# Patient Record
Sex: Female | Born: 1970
Health system: Southern US, Community
[De-identification: ages and names within clinical notes are randomized; demographics above are authoritative.]

## PROBLEM LIST (undated history)

## (undated) DIAGNOSIS — J45909 Unspecified asthma, uncomplicated: Secondary | ICD-10-CM

## (undated) DIAGNOSIS — A630 Anogenital (venereal) warts: Secondary | ICD-10-CM

## (undated) DIAGNOSIS — F329 Major depressive disorder, single episode, unspecified: Secondary | ICD-10-CM

## (undated) DIAGNOSIS — R87612 Low grade squamous intraepithelial lesion on cytologic smear of cervix (LGSIL): Secondary | ICD-10-CM

## (undated) DIAGNOSIS — E669 Obesity, unspecified: Secondary | ICD-10-CM

## (undated) DIAGNOSIS — F32A Depression, unspecified: Secondary | ICD-10-CM

## (undated) DIAGNOSIS — K219 Gastro-esophageal reflux disease without esophagitis: Secondary | ICD-10-CM

## (undated) HISTORY — DX: Obesity, unspecified: E66.9

## (undated) HISTORY — DX: Anogenital (venereal) warts: A63.0

## (undated) HISTORY — DX: Unspecified asthma, uncomplicated: J45.909

## (undated) HISTORY — DX: Depression, unspecified: F32.A

## (undated) HISTORY — DX: Gastro-esophageal reflux disease without esophagitis: K21.9

## (undated) HISTORY — DX: Major depressive disorder, single episode, unspecified: F32.9

## (undated) HISTORY — DX: Low grade squamous intraepithelial lesion on cytologic smear of cervix (LGSIL): R87.612

---

## 1998-11-28 HISTORY — PX: TONSILLECTOMY AND ADENOIDECTOMY: SUR1326

## 2005-04-12 ENCOUNTER — Ambulatory Visit: Payer: Self-pay | Admitting: Family Medicine

## 2006-12-04 ENCOUNTER — Ambulatory Visit: Payer: Self-pay | Admitting: Internal Medicine

## 2010-11-28 HISTORY — PX: LAPAROSCOPIC GASTRIC SLEEVE RESECTION: SHX5895

## 2011-01-20 ENCOUNTER — Ambulatory Visit: Payer: Self-pay | Admitting: Family Medicine

## 2011-03-28 ENCOUNTER — Ambulatory Visit: Payer: Self-pay | Admitting: Specialist

## 2011-04-06 ENCOUNTER — Ambulatory Visit: Payer: Self-pay | Admitting: Specialist

## 2011-04-12 ENCOUNTER — Ambulatory Visit: Payer: Self-pay | Admitting: Specialist

## 2011-04-19 ENCOUNTER — Ambulatory Visit: Payer: Self-pay | Admitting: Unknown Physician Specialty

## 2011-04-22 ENCOUNTER — Ambulatory Visit: Payer: Self-pay | Admitting: Unknown Physician Specialty

## 2011-04-26 HISTORY — PX: HYSTEROSCOPY WITH D & C: SHX1775

## 2011-04-29 ENCOUNTER — Ambulatory Visit: Payer: Self-pay | Admitting: Specialist

## 2012-03-22 ENCOUNTER — Ambulatory Visit: Payer: Self-pay | Admitting: Obstetrics & Gynecology

## 2012-03-22 LAB — CBC
HCT: 37.2 % (ref 35.0–47.0)
MCH: 28 pg (ref 26.0–34.0)
MCHC: 32.6 g/dL (ref 32.0–36.0)
MCV: 86 fL (ref 80–100)
RDW: 15.6 % — ABNORMAL HIGH (ref 11.5–14.5)

## 2012-03-30 ENCOUNTER — Ambulatory Visit: Payer: Self-pay | Admitting: Obstetrics & Gynecology

## 2012-03-30 LAB — PREGNANCY, URINE: Pregnancy Test, Urine: NEGATIVE m[IU]/mL

## 2012-04-03 HISTORY — PX: TOTAL VAGINAL HYSTERECTOMY: SHX2548

## 2012-04-03 HISTORY — PX: PUBOVAGINAL SLING: SHX1035

## 2013-02-11 ENCOUNTER — Emergency Department: Payer: Self-pay | Admitting: Emergency Medicine

## 2014-04-13 ENCOUNTER — Emergency Department: Payer: Self-pay | Admitting: Emergency Medicine

## 2015-03-22 NOTE — Op Note (Signed)
PATIENT NAME:  Judy Simmons, Judy Simmons MR#:  [id number] DATE OF BIRTH:  01/15/1971  DATE OF PROCEDURE:  03/30/2012  PREOPERATIVE DIAGNOSIS: Cystocele, pelvic organ prolapse, and stress incontinence.   POSTOPERATIVE DIAGNOSIS: Cystocele, pelvic organ prolapse, and stress incontinence.   PROCEDURES PERFORMED: Total vaginal hysterectomy, anterior colporrhaphy, pubovaginal sling using TVT device, cystoscopy, and modified McCall culdoplasty.   SURGEON: Dierdre Searles. Paul Rennee Coyne, Simmons.D.   ASSISTANT: Thomasene MohairStephen Jackson, Simmons.D.   ANESTHESIA: General.   ESTIMATED BLOOD LOSS: 50 mL.   COMPLICATIONS: None.   FINDINGS: Normal ovaries that are left in place, uterus slightly enlarged, prolapse and cystocele noted.   DISPOSITION: To the recovery room in stable condition.   TECHNIQUE: The patient is prepped and draped in the usual sterile fashion after adequate anesthesia is obtained in the dorsal lithotomy position. A Foley catheter is inserted. The cervix is grasped with a tenaculum. The circumference of the cervix is infiltrated with 1% lidocaine with epinephrine and then incised using Bovie electrocautery. Dissection with Metzenbaum scissors is performed until penetration of the anterior and posterior peritoneum. A long weighted speculum is placed in the posterior cul-de-sac. The uterosacral ligaments are clamped, transected, and suture ligated, and then sutured to the vaginal cuff. The uterine arteries are clamped, transected, and suture ligated with further dissection with clamping and suturing until the adnexa is clamped and the uterus is amputated. It is removed without difficulty. Excellent hemostasis is noted. The peritoneum is then closed with a pursestring 0 Vicryl suture. Ovaries are left in place. An Ethilon suture is placed to plicate the uterosacral ligaments and the posterior peritoneum.   The anterior vaginal wall is identified and clamps are placed in this region. The midline vaginal wall is infiltrated with  1% lidocaine with epinephrine and then incision made with a scalpel and extended with Metzenbaum scissors. The endopelvic fascia is dissected away from the vaginal mucosa. The direction towards the retropubic space is identified and dissected.   The Foley catheter is removed and a rigid catheter guide is placed through an 2218 French Foley catheter to deviate the bladder to either side. The TVT Exact pubovaginal sling is then placed. Trocars are placed through the retropubic space on either side with deviation of the bladder wall performing this. Trocars are removed up through the skin in the area of the mons pubis. Cystoscopy is performed with saline distention of the bladder with no injuries or bleeding noted, no graft in the bladder, and no other problems with the bladder. Cystoscope is removed and Foley catheter is inserted. The TVT is then pulled forward and pressure on the bladder before the catheter was placed revealed no leakage. The graft was careful not to be placed too tight. The sheaths are removed and is cut off at the level of the skin. Skin is closed with Dermabond. Plication sutures are then placed vaginally to imbricate the graft and then to plicate the fascial tissues for completion of the anterior colporrhaphy. Excess vaginal mucosa is excised and the vaginal incision is closed with a 0 Vicryl suture in a running locking fashion to incorporate both anterior colporrhaphy and hysterectomy sites. Vaginal cavity is irrigated and excellent hemostasis is visualized. A packing vaginal sponge is placed. Foley catheter is left in place. The patient goes to the recovery room in stable condition. All sponge, instrument, and needle counts are correct.  ____________________________ R. Annamarie MajorPaul Haizlee Henton, MD rph:slb D: 03/30/2012 16:00:47 ET T: 03/30/2012 16:34:50 ET JOB#: [id number]  cc: Dierdre Searles. Paul Massie Cogliano, MD, <Dictator> Makail Watling P  Nik Gorrell MD ELECTRONICALLY SIGNED 04/03/2012 23:27

## 2016-05-05 DIAGNOSIS — Z113 Encounter for screening for infections with a predominantly sexual mode of transmission: Secondary | ICD-10-CM | POA: Diagnosis not present

## 2016-05-05 DIAGNOSIS — F329 Major depressive disorder, single episode, unspecified: Secondary | ICD-10-CM | POA: Diagnosis not present

## 2016-05-05 DIAGNOSIS — F419 Anxiety disorder, unspecified: Secondary | ICD-10-CM | POA: Diagnosis not present

## 2016-08-27 DIAGNOSIS — J40 Bronchitis, not specified as acute or chronic: Secondary | ICD-10-CM | POA: Diagnosis not present

## 2016-11-01 DIAGNOSIS — H5213 Myopia, bilateral: Secondary | ICD-10-CM | POA: Diagnosis not present

## 2016-12-05 DIAGNOSIS — K219 Gastro-esophageal reflux disease without esophagitis: Secondary | ICD-10-CM | POA: Diagnosis not present

## 2016-12-05 DIAGNOSIS — K529 Noninfective gastroenteritis and colitis, unspecified: Secondary | ICD-10-CM | POA: Diagnosis not present

## 2017-01-16 DIAGNOSIS — M79674 Pain in right toe(s): Secondary | ICD-10-CM | POA: Diagnosis not present

## 2017-01-16 DIAGNOSIS — A499 Bacterial infection, unspecified: Secondary | ICD-10-CM | POA: Diagnosis not present

## 2017-01-16 DIAGNOSIS — B351 Tinea unguium: Secondary | ICD-10-CM | POA: Diagnosis not present

## 2017-01-23 ENCOUNTER — Encounter: Payer: Self-pay | Admitting: Family

## 2017-01-23 ENCOUNTER — Ambulatory Visit (INDEPENDENT_AMBULATORY_CARE_PROVIDER_SITE_OTHER): Payer: BLUE CROSS/BLUE SHIELD | Admitting: Family

## 2017-01-23 VITALS — BP 116/68 | HR 88 | Temp 98.1°F | Resp 15 | Ht 65.5 in | Wt 249.0 lb

## 2017-01-23 DIAGNOSIS — M5441 Lumbago with sciatica, right side: Secondary | ICD-10-CM | POA: Diagnosis not present

## 2017-01-23 DIAGNOSIS — F339 Major depressive disorder, recurrent, unspecified: Secondary | ICD-10-CM

## 2017-01-23 MED ORDER — PREDNISONE 10 MG PO TABS: ORAL_TABLET | ORAL | 0 refills | Status: DC

## 2017-01-23 MED ORDER — DICLOFENAC SODIUM 1 % TD GEL: 4.0000 g | Freq: Four times a day (QID) | TRANSDERMAL | 3 refills | Status: DC

## 2017-01-23 NOTE — Progress Notes (Signed)
Pre visit review using our clinic review tool, if applicable. No additional management support is needed unless otherwise documented below in the visit note. 

## 2017-01-23 NOTE — Assessment & Plan Note (Signed)
Symptoms consistent with sciatica. Empiric trial of prednisone. No red flag, alarm symptoms today. Patient is not a candidate for NSAIDs. Topical Voltaren gel. Return in one month.

## 2017-01-23 NOTE — Patient Instructions (Addendum)
Come back for fasting labs and physical.   Trial of prednisone. Voltaren gel.  If you start to have unusual thoughts, thoughts of hurting yourself, or anyone else, please go immediately to the emergency department.   Follow up in one month.   National Suicide Prevention Hotline - available 24 hours a day, 7 days a week.  (707)370-4106  Major Depressive Disorder Major depressive disorder is a mental illness. It also may be called clinical depression or unipolar depression. Major depressive disorder usually causes feelings of sadness, hopelessness, or helplessness. Some people with this disorder do not feel particularly sad but lose interest in doing things they used to enjoy (anhedonia). Major depressive disorder also can cause physical symptoms. It can interfere with work, school, relationships, and other normal everyday activities. The disorder varies in severity but is longer lasting and more serious than the sadness we all feel from time to time in our lives. Major depressive disorder often is triggered by stressful life events or major life changes. Examples of these triggers include divorce, loss of your job or home, a move, and the death of a family member or close friend. Sometimes this disorder occurs for no obvious reason at all. People who have family members with major depressive disorder or bipolar disorder are at higher risk for developing this disorder, with or without life stressors. Major depressive disorder can occur at any age. It may occur just once in your life (single episode major depressive disorder). It may occur multiple times (recurrent major depressive disorder). SYMPTOMS People with major depressive disorder have either anhedonia or depressed mood on nearly a daily basis for at least 2 weeks or longer. Symptoms of depressed mood include:  Feelings of sadness (blue or down in the dumps) or emptiness.  Feelings of hopelessness or helplessness.  Tearfulness or episodes  of crying (may be observed by others).  Irritability (children and adolescents). In addition to depressed mood or anhedonia or both, people with this disorder have at least four of the following symptoms:  Difficulty sleeping or sleeping too much.   Significant change (increase or decrease) in appetite or weight.   Lack of energy or motivation.  Feelings of guilt and worthlessness.   Difficulty concentrating, remembering, or making decisions.  Unusually slow movement (psychomotor retardation) or restlessness (as observed by others).   Recurrent wishes for death, recurrent thoughts of self-harm (suicide), or a suicide attempt. People with major depressive disorder commonly have persistent negative thoughts about themselves, other people, and the world. People with severe major depressive disorder may experiencedistorted beliefs or perceptions about the world (psychotic delusions). They also may see or hear things that are not real (psychotic hallucinations). DIAGNOSIS Major depressive disorder is diagnosed through an assessment by your health care provider. Your health care provider will ask aboutaspects of your daily life, such as mood,sleep, and appetite, to see if you have the diagnostic symptoms of major depressive disorder. Your health care provider may ask about your medical history and use of alcohol or drugs, including prescription medicines. Your health care provider also may do a physical exam and blood work. This is because certain medical conditions and the use of certain substances can cause major depressive disorder-like symptoms (secondary depression). Your health care provider also may refer you to a mental health specialist for further evaluation and treatment. TREATMENT It is important to recognize the symptoms of major depressive disorder and seek treatment. The following treatments can be prescribed for this disorder:   Medicine. Antidepressant medicines  usually are  prescribed. Antidepressant medicines are thought to correct chemical imbalances in the brain that are commonly associated with major depressive disorder. Other types of medicine may be added if the symptoms do not respond to antidepressant medicines alone or if psychotic delusions or hallucinations occur.  Talk therapy. Talk therapy can be helpful in treating major depressive disorder by providing support, education, and guidance. Certain types of talk therapy also can help with negative thinking (cognitive behavioral therapy) and with relationship issues that trigger this disorder (interpersonal therapy). A mental health specialist can help determine which treatment is best for you. Most people with major depressive disorder do well with a combination of medicine and talk therapy. Treatments involving electrical stimulation of the brain can be used in situations with extremely severe symptoms or when medicine and talk therapy do not work over time. These treatments include electroconvulsive therapy, transcranial magnetic stimulation, and vagal nerve stimulation.   This information is not intended to replace advice given to you by your health care provider. Make sure you discuss any questions you have with your health care provider.   Document Released: 03/11/2013 Document Revised: 12/05/2014 Document Reviewed: 03/11/2013 Elsevier Interactive Patient Education [employer]2016 Elsevier Inc.

## 2017-01-23 NOTE — Assessment & Plan Note (Signed)
Patient assured me she has no active suicidal thoughts. She continues on "for her grandson". Suicide contract in place. Follow-up in one month. Long discussion about possibly trying Wellbutrin. We'll discuss at next visit. Patient politely declined any medication or referral to counseling at this time.

## 2017-01-23 NOTE — Progress Notes (Signed)
Subjective:    Patient ID: Judy Simmons, female    DOB: 1971/10/17, 46 y.o.   MRN: [id number]  CC: Judy Simmons is a 46 y.o. female who presents today for an acute visit.    HPI: Judy Simmons - will follow with Pap.   Asthma- well controlled. Using inhaler few times per year.   Depression- current. 'rough past 2 years. ' sad about weight gain since bypass. Fleeting thoughts if she wasn't living anymore. No active suicide plan or thoughts of hurting anyone else. Goes on 'because of her grandson.' Has been on antidepressants in the past, didn't like the side effects.   Complains today low back pain for 2 weeks, waxing and waning.  Right low back, hip and then radiates to right front of leg.  numbness, tingling of right leg. Tried Has a lot of moving and lifting at work at [employer] which has aggravated back. Pain worse in the beginning with standing and 'now pain is all the time.' Describes as dull ache and then at times stabbing pain. Tried ibuprofen with little relief. Heat hasn't  Helped. No h/o cancer, saddle anesthesia, urinary incontinence.   H/o gastric bypass.     HISTORY:  Past Medical History:  Diagnosis Date  . Asthma   . Depression   . GERD (gastroesophageal reflux disease)    Past Surgical History:  Procedure Laterality Date  . ABDOMINAL HYSTERECTOMY    . BLADDER SUSPENSION    . GASTRIC BYPASS  2012   Family History  Problem Relation Age of Onset  . Diabetes Father   . Colon cancer Father   . Alcohol abuse Daughter   . Drug abuse Daughter     Allergies: Penicillin g No current outpatient prescriptions on file prior to visit.   No current facility-administered medications on file prior to visit.     Social History  Substance Use Topics  . Smoking status: Current Every Day Smoker  . Smokeless tobacco: Never Used  . Alcohol use Yes    Review of Systems  Constitutional: Negative for chills and fever.  Respiratory: Negative for cough.     Cardiovascular: Negative for chest pain and palpitations.  Gastrointestinal: Negative for nausea and vomiting.  Genitourinary: Negative for dysuria and urgency.  Musculoskeletal: Positive for back pain.  Psychiatric/Behavioral: Negative for sleep disturbance and suicidal ideas. The patient is not nervous/anxious.       Objective:    BP 116/68 (BP Location: Left Arm, Patient Position: Sitting, Cuff Size: Large)   Pulse 88   Temp 98.1 F (36.7 C) (Oral)   Resp 15   Ht 5' 5.5" (1.664 m)   Wt 249 lb (112.9 kg)   SpO2 97%   BMI 40.81 kg/m    Physical Exam  Constitutional: She appears well-developed and well-nourished.  Eyes: Conjunctivae are normal.  Cardiovascular: Normal rate, regular rhythm, normal heart sounds and normal pulses.   Pulmonary/Chest: Effort normal and breath sounds normal. She has no wheezes. She has no rhonchi. She has no rales.  Musculoskeletal:       Lumbar back: She exhibits pain. She exhibits normal range of motion, no tenderness, no bony tenderness, no swelling, no edema and no spasm.       Back:  Full range of motion with flexion, tension, lateral side bends. No bony tenderness. Pain noted over right SI joint.  No pain, numbness, tingling elicited with single leg raise bilaterally.  No pain with FABER of right hip. No pain over right  greater trochanter.   Neurological: She is alert. She has normal strength. No sensory deficit.  Reflex Scores:      Patellar reflexes are 2+ on the right side and 2+ on the left side. Sensation and strength intact bilateral lower extremities.  Skin: Skin is warm and dry.  Psychiatric: She has a normal mood and affect. Her speech is normal and behavior is normal. Thought content normal.  Vitals reviewed.      Assessment & Plan:   Problem List Items Addressed This Visit      Other   Acute right-sided low back pain with right-sided sciatica - Primary    Symptoms consistent with sciatica. Empiric trial of prednisone. No  red flag, alarm symptoms today. Patient is not a candidate for NSAIDs. Topical Voltaren gel. Return in one month.      Relevant Medications   predniSONE (DELTASONE) 10 MG tablet   diclofenac sodium (VOLTAREN) 1 % GEL   Depression, recurrent (HCC)    Patient assured me she has no active suicidal thoughts. She continues on "for her grandson". Suicide contract in place. Follow-up in one month. Long discussion about possibly trying Wellbutrin. We'll discuss at next visit. Patient politely declined any medication or referral to counseling at this time.            I am having Judy Simmons start on predniSONE and diclofenac sodium. I am also having her maintain her albuterol and multivitamin.   Meds ordered this encounter  Medications  . albuterol (PROVENTIL HFA;VENTOLIN HFA) 108 (90 Base) MCG/ACT inhaler    Sig: Inhale into the lungs.  . Multiple Vitamin (MULTIVITAMIN) tablet    Sig: Take 1 tablet by mouth daily.  . predniSONE (DELTASONE) 10 MG tablet    Sig: Take 40 mg by mouth on day 1, then taper 10 mg daily until gone    Dispense:  10 tablet    Refill:  0    Order Specific Question:   Supervising Provider    Answer:   Duncan DullULLO, TERESA L [[id number]]  . diclofenac sodium (VOLTAREN) 1 % GEL    Sig: Apply 4 g topically 4 (four) times daily.    Dispense:  1 Tube    Refill:  3    Order Specific Question:   Supervising Provider    Answer:   Sherlene ShamsULLO, TERESA L [[id number]]    Return precautions given.   Risks, benefits, and alternatives of the medications and treatment plan prescribed today were discussed, and patient expressed understanding.   Education regarding symptom management and diagnosis given to patient on AVS.  Continue to follow with Judy PlowmanMargaret Arnett, FNP for routine health maintenance.   Judy Simmons and I agreed with plan.   Judy PlowmanMargaret Arnett, FNP

## 2017-01-24 ENCOUNTER — Telehealth: Payer: Self-pay | Admitting: *Deleted

## 2017-01-24 DIAGNOSIS — M5441 Lumbago with sciatica, right side: Secondary | ICD-10-CM

## 2017-01-24 DIAGNOSIS — R350 Frequency of micturition: Secondary | ICD-10-CM

## 2017-01-24 NOTE — Telephone Encounter (Signed)
Pt requested a call to discuss medications  Pt contact [phone]

## 2017-01-26 NOTE — Telephone Encounter (Signed)
Patient state that her insurance will not cover the topical cream/gel. Please call pt at [phone]

## 2017-01-27 MED ORDER — PREDNISONE 10 MG PO TABS: ORAL_TABLET | ORAL | 0 refills | Status: DC

## 2017-01-27 NOTE — Telephone Encounter (Signed)
Please advise 

## 2017-01-27 NOTE — Telephone Encounter (Signed)
Judy GrossBrock  Spoke with pt  Ultram called into [facility] by phone  Another prednisone taper sent  No fever, rash.   Pain slightly improved on prednisone  Will try again  Has had urinary frequency, no dysuria. Pending ua future  If this does not work, pt will go to ortho or PT

## 2017-01-27 NOTE — Telephone Encounter (Signed)
Pt. has requested a call in reference to this medication. Pt reported sever pain  [phone]

## 2017-01-30 ENCOUNTER — Telehealth: Payer: Self-pay | Admitting: Family

## 2017-01-30 NOTE — Telephone Encounter (Signed)
LMTCB

## 2017-01-30 NOTE — Telephone Encounter (Signed)
Courtesy call-  How is back pain?   Prednisone, tramadol helping?

## 2017-02-01 ENCOUNTER — Telehealth: Payer: Self-pay

## 2017-02-01 NOTE — Telephone Encounter (Signed)
PA for voltaren completed on cover my meds

## 2017-02-02 NOTE — Telephone Encounter (Signed)
LMTCB

## 2017-02-03 NOTE — Telephone Encounter (Signed)
LMTCB

## 2017-02-03 NOTE — Telephone Encounter (Signed)
Pt returned phone call. Pt states pain in back is improving. Still has some pain.

## 2017-02-06 NOTE — Telephone Encounter (Signed)
noted 

## 2017-02-17 ENCOUNTER — Ambulatory Visit (INDEPENDENT_AMBULATORY_CARE_PROVIDER_SITE_OTHER): Payer: BLUE CROSS/BLUE SHIELD | Admitting: Certified Nurse Midwife

## 2017-02-17 ENCOUNTER — Encounter: Payer: Self-pay | Admitting: Certified Nurse Midwife

## 2017-02-17 VITALS — BP 112/68 | HR 95 | Ht 65.0 in | Wt 250.0 lb

## 2017-02-17 DIAGNOSIS — IMO0001 Reserved for inherently not codable concepts without codable children: Secondary | ICD-10-CM

## 2017-02-17 DIAGNOSIS — N760 Acute vaginitis: Secondary | ICD-10-CM

## 2017-02-17 DIAGNOSIS — Z8619 Personal history of other infectious and parasitic diseases: Secondary | ICD-10-CM | POA: Diagnosis not present

## 2017-02-17 DIAGNOSIS — N898 Other specified noninflammatory disorders of vagina: Secondary | ICD-10-CM

## 2017-02-17 DIAGNOSIS — Z113 Encounter for screening for infections with a predominantly sexual mode of transmission: Secondary | ICD-10-CM | POA: Diagnosis not present

## 2017-02-17 DIAGNOSIS — R87622 Low grade squamous intraepithelial lesion on cytologic smear of vagina (LGSIL): Secondary | ICD-10-CM

## 2017-02-17 DIAGNOSIS — Z01419 Encounter for gynecological examination (general) (routine) without abnormal findings: Secondary | ICD-10-CM

## 2017-02-17 DIAGNOSIS — B9689 Other specified bacterial agents as the cause of diseases classified elsewhere: Secondary | ICD-10-CM

## 2017-02-17 MED ORDER — METRONIDAZOLE 500 MG PO TABS: 500.0000 mg | ORAL_TABLET | Freq: Two times a day (BID) | ORAL | 0 refills | Status: AC

## 2017-02-18 LAB — HSV 2 ANTIBODY, IGG: HSV 2 Glycoprotein G Ab, IgG: <0.91 index (ref 0.00–0.90)

## 2017-02-18 LAB — HIV ANTIBODY (ROUTINE TESTING W REFLEX): HIV Screen 4th Generation wRfx: NONREACTIVE

## 2017-02-18 LAB — RPR QUALITATIVE: RPR Ser Ql: NONREACTIVE

## 2017-02-20 DIAGNOSIS — B351 Tinea unguium: Secondary | ICD-10-CM | POA: Diagnosis not present

## 2017-02-20 DIAGNOSIS — S90851A Superficial foreign body, right foot, initial encounter: Secondary | ICD-10-CM | POA: Diagnosis not present

## 2017-02-20 DIAGNOSIS — Z79899 Other long term (current) drug therapy: Secondary | ICD-10-CM | POA: Diagnosis not present

## 2017-02-20 DIAGNOSIS — M79674 Pain in right toe(s): Secondary | ICD-10-CM | POA: Diagnosis not present

## 2017-02-21 LAB — IGP,RFX APTIMA HPV ALL PTH: PAP SMEAR COMMENT: 0

## 2017-02-23 NOTE — Progress Notes (Addendum)
HPI:      Judy Simmons is a 46 y.o. G1P1001 : No LMP recorded. Patient has had a hysterectomy.,she presents today for her annual examination. The patient has no complaints today. The patient is sexually active.   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 Also,  has a past surgical history that includes Total vaginal hysterectomy (04/03/2012); Pubovaginal sling (04/03/2012); Tonsillectomy and adenoidectomy (2000); Hysteroscopy w/D&C (04/26/2011); and Laparoscopic gastric sleeve resection (2012)., family history includes Alcohol abuse in her daughter; Colon cancer in her father; Diabetes in her father; Drug abuse in her daughter.,  reports that she has been smoking.  She has never used smokeless tobacco. She reports that she drinks alcohol. She reports that she does not use drugs. There is no family histroy of breast or ovarian cancer. She does not do self breast exams. Her last mammogram was 12/18/2015 and was negative She has a current medication list which includes the following prescription(s): albuterol, multivitamin, and tramadol. Also, is allergic to penicillin g. Since her last annual last year she has had problems with back and leg pain. Has not been able to exercise and has gained 28#  Review of Systems  Constitutional: Negative for chills, fever and malaise/fatigue.  HENT: Negative for congestion, sinus pain and sore throat.   Eyes: Negative for blurred vision and pain.  Respiratory: Negative for cough and wheezing.   Cardiovascular: Negative for chest pain and leg swelling.  Gastrointestinal: Negative for abdominal pain, constipation, diarrhea, heartburn, nausea and vomiting.  Genitourinary: Negative for dysuria, frequency, hematuria and  urgency.  Musculoskeletal: Positive for back pain. Negative for joint pain, myalgias and neck pain.       Pain in legs (sciatica)  Skin: Negative for itching and rash.  Neurological: Negative for dizziness, tremors and weakness.  Endo/Heme/Allergies: Does not bruise/bleed easily.  Psychiatric/Behavioral: Negative for depression. The patient is not nervous/anxious and does not have insomnia.     Objective: BP 112/68 (BP Location: Left Arm, Patient Position: Sitting, Cuff Size: Large)   Pulse 95   Ht 5\' 5"  (1.651 m)   Wt 113.4 kg (250 lb)   BMI 41.60 kg/m  Physical Exam  Constitutional: She is oriented to person, place, and time. She appears well-developed and well-nourished. No distress.  Genitourinary: Rectum normal. Pelvic exam was performed with patient supine. There is no rash or lesion on the right labia. There is no rash or lesion on the left labia. Vagina exhibits no lesion. No bleeding in the vagina. Vaginal discharge found. Right adnexum does not display mass and does not display tenderness. Left adnexum does not display mass and does not display tenderness.  Genitourinary Comments: Absent Uterus Absent cervix Vaginal cuff well healed. Inceased white discharge noted  HENT:  Head: Normocephalic and atraumatic. Head is without laceration.  Neck: Normal range of motion. Neck supple. No thyromegaly present.  Cardiovascular: Normal rate and regular rhythm.  Exam reveals no gallop and no friction rub.   No murmur heard. Pulmonary/Chest: Effort normal and breath sounds normal. No respiratory distress. She has no wheezes. Right breast exhibits no mass, no skin change and no tenderness. Left breast exhibits no mass, no skin change and no tenderness.  Abdominal: Soft. She exhibits no distension. There is no hepatomegaly. There  is no tenderness. There is no rebound. Hernia confirmed negative in the right inguinal area and confirmed negative in the left inguinal area.  Musculoskeletal:  Normal range of motion.  Lymphadenopathy:    She has no cervical adenopathy.    She has no axillary adenopathy.       Right: No supraclavicular adenopathy present.       Left: No supraclavicular adenopathy present.  No infraclavicular adenopathy  Neurological: She is alert and oriented to person, place, and time. No cranial nerve deficit.  Skin: Skin is warm and dry.  Psychiatric: She has a normal mood and affect. Judgment normal.  Vitals reviewed.  Wet prep positive for clue cells, negative for Trichimonas or hyphae Assessment:  ANNUAL EXAM 1. Encounter for gynecological examination   2. History of human papillomavirus infection   3. Screening for STDs (sexually transmitted diseases)   4. Class 3 obesity due to excess calories without serious comorbidity with body mass index (BMI) of 40.0 to 44.9 in adult (HCC)   5. LGSIL Pap smear of vagina   6. Vaginal discharge   7. Bacterial vaginosis   6. Family history of colon cancer 7. Bacterial vaginosis   Screening Plan:            1.  Cervical Screening-  Pap smear done today  2. Breast screening- Exam annually annual screening mammogram recommended. Patient to schedule mammogram.  Other:  1. Encounter for gynecological examination - IGP,rfx Aptima HPV all pth  2. History of human papillomavirus infection - IGP,rfx Aptima HPV all pth  3. Screening for STDs (sexually transmitted diseases) - HIV antibody - RPR Qual - HSV 2 antibody, Ig -GC/Chlamydia  4.Colon cancer screening- recommend starting colonoscopies due to family hx.  5. Flagyl 500 mgm BID x 7days ( no alcohol x 9 days). Take with food      F/U in 1 year and prn    I have personally reviewed the patient's history, indication for care, and the plan as formulated by the midwife. I agree with the above elements or have edited them to reflect my assessment and plan. Annamarie Major, MD, [facility], [facility] 02/26/2017  9:26 PM  Farrel Conners, CNM

## 2017-02-26 ENCOUNTER — Encounter: Payer: Self-pay | Admitting: Certified Nurse Midwife

## 2017-02-26 DIAGNOSIS — E669 Obesity, unspecified: Secondary | ICD-10-CM | POA: Insufficient documentation

## 2017-02-26 DIAGNOSIS — J45909 Unspecified asthma, uncomplicated: Secondary | ICD-10-CM | POA: Insufficient documentation

## 2017-02-26 DIAGNOSIS — R87622 Low grade squamous intraepithelial lesion on cytologic smear of vagina (LGSIL): Secondary | ICD-10-CM | POA: Insufficient documentation

## 2017-02-26 LAB — POCT WET PREP (WET MOUNT): Trichomonas Wet Prep HPF POC: ABSENT

## 2017-02-26 NOTE — Addendum Note (Signed)
Addended by: Farrel Conners on: 02/26/2017 09:25 PM   Modules accepted: Orders

## 2017-02-27 ENCOUNTER — Ambulatory Visit: Payer: BLUE CROSS/BLUE SHIELD | Admitting: Family

## 2017-02-27 ENCOUNTER — Telehealth: Payer: Self-pay | Admitting: Family

## 2017-02-27 NOTE — Telephone Encounter (Signed)
FYI - Pt cancelled appt. Pt states that she is short staffed at work.

## 2017-02-27 NOTE — Telephone Encounter (Signed)
FYI

## 2017-04-13 ENCOUNTER — Ambulatory Visit (INDEPENDENT_AMBULATORY_CARE_PROVIDER_SITE_OTHER): Payer: BLUE CROSS/BLUE SHIELD

## 2017-04-13 ENCOUNTER — Ambulatory Visit (INDEPENDENT_AMBULATORY_CARE_PROVIDER_SITE_OTHER): Payer: BLUE CROSS/BLUE SHIELD | Admitting: Family

## 2017-04-13 ENCOUNTER — Encounter: Payer: Self-pay | Admitting: Family

## 2017-04-13 VITALS — BP 124/82 | HR 74 | Temp 98.1°F | Resp 16 | Ht 65.0 in | Wt 255.2 lb

## 2017-04-13 DIAGNOSIS — M47816 Spondylosis without myelopathy or radiculopathy, lumbar region: Secondary | ICD-10-CM | POA: Diagnosis not present

## 2017-04-13 DIAGNOSIS — M5441 Lumbago with sciatica, right side: Secondary | ICD-10-CM | POA: Diagnosis not present

## 2017-04-13 DIAGNOSIS — F339 Major depressive disorder, recurrent, unspecified: Secondary | ICD-10-CM

## 2017-04-13 MED ORDER — BUPROPION HCL ER (XL) 150 MG PO TB24: 150.0000 mg | ORAL_TABLET | Freq: Every day | ORAL | 1 refills | Status: DC

## 2017-04-13 NOTE — Progress Notes (Signed)
Subjective:    Patient ID: Judy Simmons, female    DOB: 1971/04/27, 46 y.o.   MRN: [id number]  CC: Judy Simmons is a 46 y.o. female who presents today to establish care.    HPI: Low back pain- after 2 rounds of prednisone, resolved, then returned and then pain has improved on its own.  Waxed and waned.  At the same time, cut hours at work and 'less physically demanding' so wander if why back pain improved. Tramadol made her 'too sleepy.'   Depression- no active plan for suicide. 'lives for grandson'; would like to start wellbutrin.       HISTORY:  Past Medical History:  Diagnosis Date  . Asthma   . Depression   . GERD (gastroesophageal reflux disease)   . Obesity    Past Surgical History:  Procedure Laterality Date  . HYSTEROSCOPY W/D&C  04/26/2011   simple hyperplasia, endometrial polyp  . LAPAROSCOPIC GASTRIC SLEEVE RESECTION  2012  . PUBOVAGINAL SLING  04/03/2012   using TVTdevice  . TONSILLECTOMY AND ADENOIDECTOMY  2000  . TOTAL VAGINAL HYSTERECTOMY  04/03/2012   uterine prolapse   Family History  Problem Relation Age of Onset  . Diabetes Father   . Colon cancer Father   . Alcohol abuse Daughter   . Drug abuse Daughter     Allergies: Penicillin g Current Outpatient Prescriptions on File Prior to Visit  Medication Sig Dispense Refill  . albuterol (PROVENTIL HFA;VENTOLIN HFA) 108 (90 Base) MCG/ACT inhaler Inhale into the lungs.    . Multiple Vitamin (MULTIVITAMIN) tablet Take 1 tablet by mouth daily.    . traMADol (ULTRAM) 50 MG tablet   0   No current facility-administered medications on file prior to visit.     Social History  Substance Use Topics  . Smoking status: Current Every Day Smoker  . Smokeless tobacco: Never Used  . Alcohol use Yes    Review of Systems  Constitutional: Negative for chills and fever.  Respiratory: Negative for cough.   Cardiovascular: Negative for chest pain and palpitations.  Gastrointestinal: Negative  for nausea and vomiting.  Psychiatric/Behavioral: Negative for suicidal ideas.      Objective:    BP 124/82 (BP Location: Left Arm, Patient Position: Sitting, Cuff Size: Normal)   Pulse 74   Temp 98.1 F (36.7 C) (Oral)   Resp 16   Ht 5\' 5"  (1.651 m)   Wt 255 lb 4 oz (115.8 kg)   SpO2 98%   BMI 42.48 kg/m  BP Readings from Last 3 Encounters:  04/13/17 124/82  02/17/17 112/68  01/23/17 116/68   Wt Readings from Last 3 Encounters:  04/13/17 255 lb 4 oz (115.8 kg)  02/17/17 250 lb (113.4 kg)  01/23/17 249 lb (112.9 kg)    Physical Exam  Constitutional: She appears well-developed and well-nourished.  Eyes: Conjunctivae are normal.  Cardiovascular: Normal rate, regular rhythm, normal heart sounds and normal pulses.   Pulmonary/Chest: Effort normal and breath sounds normal. She has no wheezes. She has no rhonchi. She has no rales.  Neurological: She is alert.  Skin: Skin is warm and dry.  Psychiatric: She has a normal mood and affect. Her speech is normal and behavior is normal. Thought content normal.  Vitals reviewed.      Assessment & Plan:   Problem List Items Addressed This Visit      Other   Acute right-sided low back pain with right-sided sciatica - Primary    Waxing and  waning. Pending XR. Referral to PT and orthopedics as likely patient would benefit from injection      Relevant Orders   Ambulatory referral to Orthopedic Surgery   Ambulatory referral to Physical Therapy   DG Lumbar Spine Complete   Depression, recurrent (HCC)    Unchanged. No active SI thoughts. Trial wellbutrin, f/u 8 weeks.      Relevant Medications   buPROPion (WELLBUTRIN XL) 150 MG 24 hr tablet       I am having Judy Simmons start on buPROPion. I am also having her maintain her albuterol, multivitamin, and traMADol.   Meds ordered this encounter  Medications  . buPROPion (WELLBUTRIN XL) 150 MG 24 hr tablet    Sig: Take 1 tablet (150 mg total) by mouth daily. Take one tablet  by mouth every morning for 7 days, and then increase to two tablets by mouth every morning.    Dispense:  90 tablet    Refill:  1    Order Specific Question:   Supervising Provider    Answer:   Sherlene Shams [[id number]]    Return precautions given.   Risks, benefits, and alternatives of the medications and treatment plan prescribed today were discussed, and patient expressed understanding.   Education regarding symptom management and diagnosis given to patient on AVS.  Continue to follow with Judy Grana, FNP for routine health maintenance.   Judy Simmons and I agreed with plan.   Rennie Plowman, FNP

## 2017-04-13 NOTE — Patient Instructions (Addendum)
Trial of wellbutrin  Xrays today    F/u 8 weeks    Our hope is for gradual improvement of mood since starting medication; however this may take several weeks.   If you start to have unusual thoughts, thoughts of hurting yourself, or anyone else, please go immediately to the emergency department.   Follow up in 8 weeks.   National Suicide Prevention Hotline - available 24 hours a day, 7 days a week.  [phone]  Major Depressive Disorder Major depressive disorder is a mental illness. It also may be called clinical depression or unipolar depression. Major depressive disorder usually causes feelings of sadness, hopelessness, or helplessness. Some people with this disorder do not feel particularly sad but lose interest in doing things they used to enjoy (anhedonia). Major depressive disorder also can cause physical symptoms. It can interfere with work, school, relationships, and other normal everyday activities. The disorder varies in severity but is longer lasting and more serious than the sadness we all feel from time to time in our lives. Major depressive disorder often is triggered by stressful life events or major life changes. Examples of these triggers include divorce, loss of your job or home, a move, and the death of a family member or close friend. Sometimes this disorder occurs for no obvious reason at all. People who have family members with major depressive disorder or bipolar disorder are at higher risk for developing this disorder, with or without life stressors. Major depressive disorder can occur at any age. It may occur just once in your life (single episode major depressive disorder). It may occur multiple times (recurrent major depressive disorder). SYMPTOMS People with major depressive disorder have either anhedonia or depressed mood on nearly a daily basis for at least 2 weeks or longer. Symptoms of depressed mood include:  Feelings of sadness (blue or down in the  dumps) or emptiness.  Feelings of hopelessness or helplessness.  Tearfulness or episodes of crying (may be observed by others).  Irritability (children and adolescents). In addition to depressed mood or anhedonia or both, people with this disorder have at least four of the following symptoms:  Difficulty sleeping or sleeping too much.   Significant change (increase or decrease) in appetite or weight.   Lack of energy or motivation.  Feelings of guilt and worthlessness.   Difficulty concentrating, remembering, or making decisions.  Unusually slow movement (psychomotor retardation) or restlessness (as observed by others).   Recurrent wishes for death, recurrent thoughts of self-harm (suicide), or a suicide attempt. People with major depressive disorder commonly have persistent negative thoughts about themselves, other people, and the world. People with severe major depressive disorder may experiencedistorted beliefs or perceptions about the world (psychotic delusions). They also may see or hear things that are not real (psychotic hallucinations). DIAGNOSIS Major depressive disorder is diagnosed through an assessment by your health care provider. Your health care provider will ask aboutaspects of your daily life, such as mood,sleep, and appetite, to see if you have the diagnostic symptoms of major depressive disorder. Your health care provider may ask about your medical history and use of alcohol or drugs, including prescription medicines. Your health care provider also may do a physical exam and blood work. This is because certain medical conditions and the use of certain substances can cause major depressive disorder-like symptoms (secondary depression). Your health care provider also may refer you to a mental health specialist for further evaluation and treatment. TREATMENT It is important to recognize the symptoms of major  depressive disorder and seek treatment. The following  treatments can be prescribed for this disorder:   Medicine. Antidepressant medicines usually are prescribed. Antidepressant medicines are thought to correct chemical imbalances in the brain that are commonly associated with major depressive disorder. Other types of medicine may be added if the symptoms do not respond to antidepressant medicines alone or if psychotic delusions or hallucinations occur.  Talk therapy. Talk therapy can be helpful in treating major depressive disorder by providing support, education, and guidance. Certain types of talk therapy also can help with negative thinking (cognitive behavioral therapy) and with relationship issues that trigger this disorder (interpersonal therapy). A mental health specialist can help determine which treatment is best for you. Most people with major depressive disorder do well with a combination of medicine and talk therapy. Treatments involving electrical stimulation of the brain can be used in situations with extremely severe symptoms or when medicine and talk therapy do not work over time. These treatments include electroconvulsive therapy, transcranial magnetic stimulation, and vagal nerve stimulation.   This information is not intended to replace advice given to you by your health care provider. Make sure you discuss any questions you have with your health care provider.   Document Released: 03/11/2013 Document Revised: 12/05/2014 Document Reviewed: 03/11/2013 Elsevier Interactive Patient Education [employer]

## 2017-04-13 NOTE — Assessment & Plan Note (Signed)
Unchanged. No active SI thoughts. Trial wellbutrin, f/u 8 weeks.

## 2017-04-13 NOTE — Assessment & Plan Note (Signed)
Waxing and waning. Pending XR. Referral to PT and orthopedics as likely patient would benefit from injection

## 2017-04-13 NOTE — Progress Notes (Signed)
Pre visit review using our clinic review tool, if applicable. No additional management support is needed unless otherwise documented below in the visit note. 

## 2017-04-17 ENCOUNTER — Other Ambulatory Visit: Payer: Self-pay | Admitting: Family

## 2017-04-17 ENCOUNTER — Telehealth: Payer: Self-pay | Admitting: *Deleted

## 2017-04-17 DIAGNOSIS — M545 Low back pain: Principal | ICD-10-CM

## 2017-04-17 DIAGNOSIS — G8929 Other chronic pain: Secondary | ICD-10-CM

## 2017-04-17 NOTE — Telephone Encounter (Signed)
Call returned see imaging notes dated 04/13/17

## 2017-04-17 NOTE — Telephone Encounter (Signed)
Patient requested a call   after 1 pm  [phone]

## 2017-05-03 ENCOUNTER — Telehealth: Payer: Self-pay

## 2017-05-03 NOTE — Telephone Encounter (Signed)
Pt calling for her recent results.  Left detailed msg all normal/negative.

## 2017-06-07 DIAGNOSIS — M5440 Lumbago with sciatica, unspecified side: Secondary | ICD-10-CM | POA: Diagnosis not present

## 2017-06-13 ENCOUNTER — Ambulatory Visit: Payer: BLUE CROSS/BLUE SHIELD | Admitting: Family

## 2017-06-16 DIAGNOSIS — M5440 Lumbago with sciatica, unspecified side: Secondary | ICD-10-CM | POA: Diagnosis not present

## 2017-06-16 DIAGNOSIS — M2550 Pain in unspecified joint: Secondary | ICD-10-CM | POA: Diagnosis not present

## 2017-06-16 DIAGNOSIS — M5136 Other intervertebral disc degeneration, lumbar region: Secondary | ICD-10-CM | POA: Diagnosis not present

## 2017-06-16 DIAGNOSIS — E669 Obesity, unspecified: Secondary | ICD-10-CM | POA: Diagnosis not present

## 2017-06-16 DIAGNOSIS — G8929 Other chronic pain: Secondary | ICD-10-CM | POA: Diagnosis not present

## 2017-06-30 DIAGNOSIS — M5136 Other intervertebral disc degeneration, lumbar region: Secondary | ICD-10-CM | POA: Diagnosis not present

## 2017-06-30 DIAGNOSIS — E669 Obesity, unspecified: Secondary | ICD-10-CM | POA: Diagnosis not present

## 2017-06-30 DIAGNOSIS — M5440 Lumbago with sciatica, unspecified side: Secondary | ICD-10-CM | POA: Diagnosis not present

## 2017-06-30 DIAGNOSIS — Z6841 Body Mass Index (BMI) 40.0 and over, adult: Secondary | ICD-10-CM | POA: Diagnosis not present

## 2017-07-18 DIAGNOSIS — M5440 Lumbago with sciatica, unspecified side: Secondary | ICD-10-CM | POA: Diagnosis not present

## 2017-08-03 DIAGNOSIS — M5440 Lumbago with sciatica, unspecified side: Secondary | ICD-10-CM | POA: Diagnosis not present

## 2017-08-09 DIAGNOSIS — M5440 Lumbago with sciatica, unspecified side: Secondary | ICD-10-CM | POA: Diagnosis not present

## 2017-08-16 DIAGNOSIS — M5440 Lumbago with sciatica, unspecified side: Secondary | ICD-10-CM | POA: Diagnosis not present

## 2017-08-23 DIAGNOSIS — M5440 Lumbago with sciatica, unspecified side: Secondary | ICD-10-CM | POA: Diagnosis not present

## 2017-08-30 DIAGNOSIS — M5440 Lumbago with sciatica, unspecified side: Secondary | ICD-10-CM | POA: Diagnosis not present

## 2017-09-06 DIAGNOSIS — M5440 Lumbago with sciatica, unspecified side: Secondary | ICD-10-CM | POA: Diagnosis not present

## 2017-09-13 DIAGNOSIS — M5440 Lumbago with sciatica, unspecified side: Secondary | ICD-10-CM | POA: Diagnosis not present

## 2017-09-20 DIAGNOSIS — M5440 Lumbago with sciatica, unspecified side: Secondary | ICD-10-CM | POA: Diagnosis not present

## 2017-10-04 DIAGNOSIS — M5440 Lumbago with sciatica, unspecified side: Secondary | ICD-10-CM | POA: Diagnosis not present

## 2017-10-11 DIAGNOSIS — M5440 Lumbago with sciatica, unspecified side: Secondary | ICD-10-CM | POA: Diagnosis not present

## 2017-10-18 DIAGNOSIS — M5440 Lumbago with sciatica, unspecified side: Secondary | ICD-10-CM | POA: Diagnosis not present

## 2017-11-27 ENCOUNTER — Encounter: Payer: Self-pay | Admitting: Nurse Practitioner

## 2017-11-27 ENCOUNTER — Ambulatory Visit: Payer: BLUE CROSS/BLUE SHIELD | Admitting: Nurse Practitioner

## 2017-11-27 VITALS — BP 110/70 | HR 82 | Temp 98.1°F | Ht 65.0 in | Wt 252.0 lb

## 2017-11-27 DIAGNOSIS — J069 Acute upper respiratory infection, unspecified: Secondary | ICD-10-CM | POA: Diagnosis not present

## 2017-11-27 DIAGNOSIS — J209 Acute bronchitis, unspecified: Secondary | ICD-10-CM | POA: Insufficient documentation

## 2017-11-27 DIAGNOSIS — J4541 Moderate persistent asthma with (acute) exacerbation: Secondary | ICD-10-CM | POA: Diagnosis not present

## 2017-11-27 MED ORDER — ALBUTEROL SULFATE (2.5 MG/3ML) 0.083% IN NEBU
2.5000 mg | INHALATION_SOLUTION | Freq: Once | RESPIRATORY_TRACT | Status: AC
  Administered 2017-11-27: 2.5 mg via RESPIRATORY_TRACT

## 2017-11-27 MED ORDER — DM-GUAIFENESIN ER 30-600 MG PO TB12: 1.0000 | ORAL_TABLET | Freq: Two times a day (BID) | ORAL | 0 refills | Status: DC | PRN

## 2017-11-27 MED ORDER — AZITHROMYCIN 250 MG PO TABS: 250.0000 mg | ORAL_TABLET | Freq: Every day | ORAL | 0 refills | Status: DC

## 2017-11-27 MED ORDER — HYDROCODONE-HOMATROPINE 5-1.5 MG/5ML PO SYRP: 5.0000 mL | ORAL_SOLUTION | Freq: Four times a day (QID) | ORAL | 0 refills | Status: DC | PRN

## 2017-11-27 MED ORDER — ALBUTEROL SULFATE HFA 108 (90 BASE) MCG/ACT IN AERS: 1.0000 | INHALATION_SPRAY | Freq: Four times a day (QID) | RESPIRATORY_TRACT | 0 refills | Status: DC | PRN

## 2017-11-27 MED ORDER — IPRATROPIUM BROMIDE 0.03 % NA SOLN: 2.0000 | Freq: Two times a day (BID) | NASAL | 0 refills | Status: DC

## 2017-11-27 NOTE — Progress Notes (Signed)
Subjective:  Patient ID: Judy Simmons, female    DOB: [birth year]  Age: 46 y.o. MRN: [id number]  CC: Cough (coughing yellow green mucus,congestion,wheezing-2 days/)   URI   This is a new problem. The current episode started in the past 7 days. The problem has been gradually worsening. There has been no fever. Associated symptoms include congestion, coughing, rhinorrhea, sinus pain, sneezing, a sore throat and wheezing. Pertinent negatives include no vomiting. She has tried nothing for the symptoms.  current daily tobacco use  Outpatient Medications Prior to Visit  Medication Sig Dispense Refill  . Multiple Vitamin (MULTIVITAMIN) tablet Take 1 tablet by mouth daily.    . traMADol (ULTRAM) 50 MG tablet   0  . albuterol (PROVENTIL HFA;VENTOLIN HFA) 108 (90 Base) MCG/ACT inhaler Inhale into the lungs.    Marland Kitchen. buPROPion (WELLBUTRIN XL) 150 MG 24 hr tablet Take 1 tablet (150 mg total) by mouth daily. Take one tablet by mouth every morning for 7 days, and then increase to two tablets by mouth every morning. (Patient not taking: Reported on 11/27/2017) 90 tablet 1   No facility-administered medications prior to visit.     ROS See HPI  Objective:  BP 110/70   Pulse 82   Temp 98.1 F (36.7 C)   Ht 5\' 5"  (1.651 m)   Wt 252 lb (114.3 kg)   SpO2 100%   BMI 41.93 kg/m   BP Readings from Last 3 Encounters:  11/27/17 110/70  04/13/17 124/82  02/17/17 112/68    Wt Readings from Last 3 Encounters:  11/27/17 252 lb (114.3 kg)  04/13/17 255 lb 4 oz (115.8 kg)  02/17/17 250 lb (113.4 kg)    Physical Exam  Constitutional: She is oriented to person, place, and time. No distress.  HENT:  Right Ear: Tympanic membrane, external ear and ear canal normal.  Left Ear: Tympanic membrane, external ear and ear canal normal.  Nose: Mucosal edema and rhinorrhea present. Right sinus exhibits maxillary sinus tenderness. Right sinus exhibits no frontal sinus tenderness. Left sinus exhibits  maxillary sinus tenderness. Left sinus exhibits no frontal sinus tenderness.  Mouth/Throat: Uvula is midline. No trismus in the jaw. Posterior oropharyngeal erythema present. No oropharyngeal exudate.  Eyes: No scleral icterus.  Neck: Normal range of motion. Neck supple.  Cardiovascular: Normal rate and normal heart sounds.  Pulmonary/Chest: Effort normal. No respiratory distress. She has wheezes. She has no rales.  Clear lung sounds after nebulizer treatment.  Musculoskeletal: She exhibits no edema.  Lymphadenopathy:    She has no cervical adenopathy.  Neurological: She is alert and oriented to person, place, and time.  Skin: Skin is warm and dry.  Psychiatric: She has a normal mood and affect. Her behavior is normal.  Vitals reviewed.   Lab Results  Component Value Date   WBC 4.6 03/22/2012   HGB 10.7 (L) 03/31/2012   HCT 37.2 03/22/2012   PLT 297 03/22/2012    Assessment & Plan:   [name] was seen today for cough.  Diagnoses and all orders for this visit:  Acute URI -     albuterol (PROVENTIL) (2.5 MG/3ML) 0.083% nebulizer solution 2.5 mg -     dextromethorphan-guaiFENesin (MUCINEX DM) 30-600 MG 12hr tablet; Take 1 tablet by mouth 2 (two) times daily as needed for cough. -     azithromycin (ZITHROMAX Z-PAK) 250 MG tablet; Take 1 tablet (250 mg total) by mouth daily. Take 2tabs on first day, then 1tab once a day till complete -  HYDROcodone-homatropine (HYCODAN) 5-1.5 MG/5ML syrup; Take 5 mLs by mouth every 6 (six) hours as needed for cough. -     albuterol (PROVENTIL HFA;VENTOLIN HFA) 108 (90 Base) MCG/ACT inhaler; Inhale 1-2 puffs into the lungs every 6 (six) hours as needed for wheezing or shortness of breath. -     ipratropium (ATROVENT) 0.03 % nasal spray; Place 2 sprays into both nostrils 2 (two) times daily. Do not use for more than 5days.  Moderate persistent asthma with acute exacerbation -     albuterol (PROVENTIL) (2.5 MG/3ML) 0.083% nebulizer solution 2.5  mg -     dextromethorphan-guaiFENesin (MUCINEX DM) 30-600 MG 12hr tablet; Take 1 tablet by mouth 2 (two) times daily as needed for cough. -     azithromycin (ZITHROMAX Z-PAK) 250 MG tablet; Take 1 tablet (250 mg total) by mouth daily. Take 2tabs on first day, then 1tab once a day till complete -     HYDROcodone-homatropine (HYCODAN) 5-1.5 MG/5ML syrup; Take 5 mLs by mouth every 6 (six) hours as needed for cough. -     albuterol (PROVENTIL HFA;VENTOLIN HFA) 108 (90 Base) MCG/ACT inhaler; Inhale 1-2 puffs into the lungs every 6 (six) hours as needed for wheezing or shortness of breath.   I have changed Judy Simmons's albuterol. I am also having her start on dextromethorphan-guaiFENesin, azithromycin, HYDROcodone-homatropine, and ipratropium. Additionally, I am having her maintain her multivitamin, traMADol, and buPROPion. We administered albuterol.  Meds ordered this encounter  Medications  . albuterol (PROVENTIL) (2.5 MG/3ML) 0.083% nebulizer solution 2.5 mg  . dextromethorphan-guaiFENesin (MUCINEX DM) 30-600 MG 12hr tablet    Sig: Take 1 tablet by mouth 2 (two) times daily as needed for cough.    Dispense:  14 tablet    Refill:  0    Order Specific Question:   Supervising Provider    Answer:   Dianne DunARON, TALIA M [[id number]]  . azithromycin (ZITHROMAX Z-PAK) 250 MG tablet    Sig: Take 1 tablet (250 mg total) by mouth daily. Take 2tabs on first day, then 1tab once a day till complete    Dispense:  6 tablet    Refill:  0    Order Specific Question:   Supervising Provider    Answer:   Dianne DunARON, TALIA M [[id number]]  . HYDROcodone-homatropine (HYCODAN) 5-1.5 MG/5ML syrup    Sig: Take 5 mLs by mouth every 6 (six) hours as needed for cough.    Dispense:  60 mL    Refill:  0    Order Specific Question:   Supervising Provider    Answer:   Dianne DunARON, TALIA M [[id number]]  . albuterol (PROVENTIL HFA;VENTOLIN HFA) 108 (90 Base) MCG/ACT inhaler    Sig: Inhale 1-2 puffs into the lungs every 6 (six) hours as needed for  wheezing or shortness of breath.    Dispense:  1 Inhaler    Refill:  0    Order Specific Question:   Supervising Provider    Answer:   Dianne DunARON, TALIA M [[id number]]  . ipratropium (ATROVENT) 0.03 % nasal spray    Sig: Place 2 sprays into both nostrils 2 (two) times daily. Do not use for more than 5days.    Dispense:  30 mL    Refill:  0    Order Specific Question:   Supervising Provider    Answer:   Dianne DunARON, TALIA M [[id number]]    Follow-up: Return if symptoms worsen or fail to improve.  Alysia Pennaharlotte Aarib Pulido, NP

## 2017-12-13 ENCOUNTER — Ambulatory Visit: Payer: Self-pay | Admitting: *Deleted

## 2017-12-13 NOTE — Telephone Encounter (Signed)
Pt called complaining of right arm pain that started a month ago; she states that pain ranges from moderate to severe and she has limited range of motion; per nurse triage recommendation made that pt goes to ED but she states that she does not think that it merits an ED visit since it has been hurting for a month; [facility] the [facility]'s [facility] constraints; pt offered and accepted an appointment with Anders SimmondsJeremy Scmitz at [facility]  12/18/17 at 0850; pt verbalizes understanding, Reason for Disposition . [1] Age > 40 AND [2] no obvious cause AND [3] pain even when not moving the arm    (Exception: pain is clearly made worse by moving arm or bending neck)  Answer Assessment - Initial Assessment Questions 1. ONSET: "When did the pain start?"     A month ago 2. LOCATION: "Where is the pain located?"     Right forearm not the elbow (no joint pain) 3. PAIN: "How bad is the pain?" (Scale 1-10; or mild, moderate, severe)   - MILD (1-3): doesn't interfere with normal activities   - MODERATE (4-7): interferes with normal activities (e.g., work or school) or awakens from sleep   - SEVERE (8-10): excruciating pain, unable to do any normal activities, unable to hold a cup of water     Severe; limited range of motion 4. WORK OR EXERCISE: "Has there been any recent work or exercise that involved this part of the body?"     no 5. CAUSE: "What do you think is causing the arm pain?"   unsure 6. OTHER SYMPTOMS: "Do you have any other symptoms?" (e.g., neck pain, swelling, rash, fever, numbness, weakness)     weakness 7. PREGNANCY: "Is there any chance you are pregnant?" "When was your last menstrual period?"     No hysterectomy  Protocols used: ARM PAIN-A-AH

## 2017-12-18 ENCOUNTER — Ambulatory Visit: Payer: BLUE CROSS/BLUE SHIELD | Admitting: Family Medicine

## 2017-12-18 ENCOUNTER — Encounter: Payer: Self-pay | Admitting: Family Medicine

## 2017-12-18 VITALS — BP 132/78 | HR 82 | Temp 98.2°F | Ht 65.0 in | Wt 258.0 lb

## 2017-12-18 DIAGNOSIS — M25521 Pain in right elbow: Secondary | ICD-10-CM | POA: Diagnosis not present

## 2017-12-18 MED ORDER — DICLOFENAC SODIUM 2 % TD SOLN: 1.0000 "application " | Freq: Two times a day (BID) | TRANSDERMAL | 3 refills | Status: DC

## 2017-12-18 NOTE — Patient Instructions (Signed)
Please try to avoid repetitive maneuvers  Please try to perform the exercises if the pain is less than 2/10 Please follow up in 4-6 weeks if your pain hasn't improved.  You can try compression on the area.

## 2017-12-18 NOTE — Progress Notes (Signed)
Judy Simmons - 47 y.o. female MRN [id number]  Date of birth: Mar 28, 1971  SUBJECTIVE:  Including CC & ROS.  Chief Complaint  Patient presents with  . Right arm Pain    Judy Simmons is a 47 y.o. female that is presenting with right arm pain. Pain has been ongoing for one month. Denies injury or previous surgery. Pain is located on the proximal extensors but no pain on the LE. Denies swelling. States there was redness present. Pain is constant achy pulling sensation. Pain triggered by moving her arm and adding weight to it.Has not taken anything for the pain. She does lift a lot at her part time job. Pain is constant in nature. Pain is severe. Pain is located to the proximal forearm. Nothing has helped. Cannot take NSAIDS due to history of gastric surgery. She denies any radiation up or down. Pain is worse with lifting things. Doesn't remember a specific trigger. Works at [employer] and at [employer].       Review of Systems  Constitutional: Negative for fever.  HENT: Negative for sinus pain.   Respiratory: Negative for cough.   Cardiovascular: Negative for chest pain.  Gastrointestinal: Negative for abdominal pain.  Musculoskeletal: Negative for gait problem.  Skin: Negative for color change.  Neurological: Negative for weakness.  Hematological: Negative for adenopathy.  Psychiatric/Behavioral: Negative for agitation.    HISTORY: Past Medical, Surgical, Social, and Family History Reviewed & Updated per EMR.   Pertinent Historical Findings include:  Past Medical History:  Diagnosis Date  . Asthma   . Depression   . GERD (gastroesophageal reflux disease)   . Obesity     Past Surgical History:  Procedure Laterality Date  . HYSTEROSCOPY W/D&C  04/26/2011   simple hyperplasia, endometrial polyp  . LAPAROSCOPIC GASTRIC SLEEVE RESECTION  2012  . PUBOVAGINAL SLING  04/03/2012   using TVTdevice  . TONSILLECTOMY AND ADENOIDECTOMY  2000  . TOTAL VAGINAL  HYSTERECTOMY  04/03/2012   uterine prolapse    Allergies  Allergen Reactions  . Penicillin G Other (See Comments)    Rxn as a Child    Family History  Problem Relation Age of Onset  . Diabetes Father   . Colon cancer Father   . Alcohol abuse Daughter   . Drug abuse Daughter      Social History   Socioeconomic History  . Marital status: Single    Spouse name: Not on file  . Number of children: 1  . Years of education: 6  . Highest education level: Not on file  Social Needs  . Financial resource strain: Not on file  . Food insecurity - worry: Not on file  . Food insecurity - inability: Not on file  . Transportation needs - medical: Not on file  . Transportation needs - non-medical: Not on file  Occupational History  . Occupation: customer service  Tobacco Use  . Smoking status: Current Every Day Smoker  . Smokeless tobacco: Never Used  Substance and Sexual Activity  . Alcohol use: Yes  . Drug use: No  . Sexual activity: Yes    Birth control/protection: Surgical  Other Topics Concern  . Not on file  Social History Narrative   Lives in [place]      Works- Health visitor      Living with parents      One daughter who has grandson        PHYSICAL EXAM:  VS: BP 132/78 (BP Location: Left Arm, Patient  Position: Sitting, Cuff Size: Large)   Pulse 82   Temp 98.2 F (36.8 C) (Oral)   Ht 5\' 5"  (1.651 m)   Wt 258 lb (117 kg)   SpO2 100%   BMI 42.93 kg/m  Physical Exam Gen: NAD, alert, cooperative with exam, well-appearing ENT: normal lips, normal nasal mucosa,  Eye: normal EOM, normal conjunctiva and lids CV:  no edema, +2 pedal pulses   Resp: no accessory muscle use, non-labored,  Skin: no rashes, no areas of induration  Neuro: normal tone, normal sensation to touch Psych:  normal insight, alert and oriented MSK:  Right elbow/forearm:  TTP of the proximal forearm common extensors  No TTP of the LE and ME  Normal elbow ROM  Normal wrist ROM   Pain with resistance to supination and pronation  No pain with resistance with wrist extension and flexion  No pain with resistance to middle finger extension  Normal grip strength  No signs of atrophy  Neurovascularly intact   Limited ultrasound: right elbow:  Normal appearing LE  Normal dynamic testing of the radial head in supination and pronation  Normal appearing radial nerve  Increased uptake up the proximal common extensors.    Summary: appears that there is increased signal of the common extensors to suggest tendinitis.   Ultrasound and interpretation by Clare GandyJeremy Schmitz, MD        ASSESSMENT & PLAN:   Right elbow pain Appears that she has tendinitis of the common extensors. No pain at LE to suggest tennis elbow. No pain radiating to suspect supinator syndrome.  - Pennsaid  - counseled on HEP  - if no improvement can try PT and/or nitro

## 2017-12-18 NOTE — Assessment & Plan Note (Signed)
Appears that she has tendinitis of the common extensors. No pain at LE to suggest tennis elbow. No pain radiating to suspect supinator syndrome.  - Pennsaid  - counseled on HEP  - if no improvement can try PT and/or nitro

## 2017-12-20 ENCOUNTER — Ambulatory Visit: Payer: BLUE CROSS/BLUE SHIELD | Admitting: Obstetrics & Gynecology

## 2017-12-20 ENCOUNTER — Encounter: Payer: Self-pay | Admitting: Obstetrics & Gynecology

## 2017-12-20 VITALS — BP 120/80 | HR 84 | Ht 65.5 in | Wt 262.0 lb

## 2017-12-20 DIAGNOSIS — Z202 Contact with and (suspected) exposure to infections with a predominantly sexual mode of transmission: Secondary | ICD-10-CM | POA: Diagnosis not present

## 2017-12-20 DIAGNOSIS — N3281 Overactive bladder: Secondary | ICD-10-CM | POA: Diagnosis not present

## 2017-12-20 MED ORDER — MIRABEGRON ER 50 MG PO TB24: 50.0000 mg | ORAL_TABLET | Freq: Every day | ORAL | 6 refills | Status: DC

## 2017-12-20 NOTE — Patient Instructions (Signed)
Mirabegron extended-release tablets What is this medicine? MIRABEGRON (MIR a BEG ron) is used to treat overactive bladder. This medicine reduces the amount of bathroom visits. It may also help to control wetting accidents. This medicine may be used for other purposes; ask your health care provider or pharmacist if you have questions. COMMON BRAND NAME(S): Myrbetriq What should I tell my health care provider before I take this medicine? They need to know if you have any of these conditions: -difficulty passing urine -high blood pressure -kidney disease -liver disease -an unusual or allergic reaction to mirabegron, other medicines, foods, dyes, or preservatives -pregnant or trying to get pregnant -breast-feeding How should I use this medicine? Take this medicine by mouth with a glass of water. Follow the directions on the prescription label. Do not cut, crush or chew this medicine. You can take it with or without food. If it upsets your stomach, take it with food. Take your medicine at regular intervals. Do not take it more often than directed. Do not stop taking except on your doctor's advice. Talk to your pediatrician regarding the use of this medicine in children. Special care may be needed. Overdosage: If you think you have taken too much of this medicine contact a poison control center or emergency room at once. NOTE: This medicine is only for you. Do not share this medicine with others. What if I miss a dose? If you miss a dose, take it as soon as you can. If it is almost time for your next dose, take only that dose. Do not take double or extra doses. What may interact with this medicine? -certain medicines for bladder problems like fesoterodine, oxybutynin, solifenacin, tolterodine -desipramine -digoxin -flecainide -ketoconazole -MAOIs like Carbex, Eldepryl, Marplan, Nardil, and Parnate -metoprolol -propafenone -thioridazine -warfarin This list may not describe all possible  interactions. Give your health care provider a list of all the medicines, herbs, non-prescription drugs, or dietary supplements you use. Also tell them if you smoke, drink alcohol, or use illegal drugs. Some items may interact with your medicine. What should I watch for while using this medicine? It may take 8 weeks to notice the full benefit from this medicine. You may need to limit your intake tea, coffee, caffeinated sodas, and alcohol. These drinks may make your symptoms worse. Visit your doctor or health care professional for regular checks on your progress. Check your blood pressure as directed. Ask your doctor or health care professional what your blood pressure should be and when you should contact him or her. What side effects may I notice from receiving this medicine? Side effects that you should report to your doctor or health care professional as soon as possible: -allergic reactions like skin rash, itching or hives, swelling of the face, lips, or tongue -chest pain or palpitations -severe or sudden headache -high blood pressure -fast, irregular heartbeat -redness, blistering, peeling or loosening of the skin, including inside the mouth -signs of infection like fever or chills; cough; sore throat; pain or difficulty passing urine -trouble passing urine or change in the amount of urine Side effects that usually do not require medical attention (report to your doctor or health care professional if they continue or are bothersome): -constipation -diarrhea -dizziness -dry eyes -joint pain -mild headache -nausea -runny nose This list may not describe all possible side effects. Call your doctor for medical advice about side effects. You may report side effects to FDA at 1-800-FDA-1088. Where should I keep my medicine? Keep out of the reach   of children. Store at room temperature between 15 and 30 degrees C (59 and 86 degrees F). Throw away any unused medicine after the expiration  date. NOTE: This sheet is a summary. It may not cover all possible information. If you have questions about this medicine, talk to your doctor, pharmacist, or health care provider.  2018 Elsevier/Gold Standard (2015-12-17 12:14:30)  

## 2017-12-20 NOTE — Addendum Note (Signed)
Addended by: Edwena FeltyARSON, LINDSAY T on: 12/20/2017 10:39 AM   Modules accepted: Orders

## 2017-12-20 NOTE — Progress Notes (Signed)
  Incontinence Patient complains of a daily uregency of the bladder with leakage if does not get to bathroom in time; this can also happen at night if doesnt get up fast enough; no real nocturia (1-2x/night) just leakage and urgency sensations.  No freq.. Problem started several months ago. Symptoms include: urinary incontinence: moderate. Also has LOU w cough and laughter.  Symptoms have gradually worsened.  [facility]     PMHx: She  has a past medical history of Asthma, Depression, GERD (gastroesophageal reflux disease), and Obesity. Also,  has a past surgical history that includes Total vaginal hysterectomy (04/03/2012); Pubovaginal sling (04/03/2012); Tonsillectomy and adenoidectomy (2000); Hysteroscopy w/D&C (04/26/2011); and Laparoscopic gastric sleeve resection (2012)., family history includes Alcohol abuse in her daughter; Colon cancer in her father; Diabetes in her father; Drug abuse in her daughter.,  reports that she has been smoking.  she has never used smokeless tobacco. She reports that she drinks alcohol. She reports that she does not use drugs.  She has a current medication list which includes the following prescription(s): albuterol, diclofenac sodium, multivitamin, terbinafine, and tramadol. Also, is allergic to penicillin g.  Review of Systems  All other systems reviewed and are negative.   Objective: BP 120/80   Pulse 84   Ht 5' 5.5" (1.664 m)   Wt 262 lb (118.8 kg)   BMI 42.94 kg/m  Physical Exam  Constitutional: She is oriented to person, place, and time. She appears well-developed and well-nourished. No distress.  Genitourinary: Rectum normal and vagina normal. Pelvic exam was performed with patient supine. There is no rash or lesion on the right labia. There is no rash or lesion on the left labia. Vagina exhibits no lesion. No bleeding in the vagina. Right adnexum does not display mass and does not display tenderness. Left adnexum does not display mass and  does not display tenderness.  Genitourinary Comments: Absent Uterus Absent cervix Vaginal cuff well healed Min cystocele No mesh erosion  HENT:  Head: Normocephalic and atraumatic. Head is without laceration.  Right Ear: Hearing normal.  Left Ear: Hearing normal.  Nose: No epistaxis.  No foreign bodies.  Mouth/Throat: Uvula is midline, oropharynx is clear and moist and mucous membranes are normal.  Eyes: Pupils are equal, round, and reactive to light.  Neck: Normal range of motion. Neck supple. No thyromegaly present.  Cardiovascular: Normal rate and regular rhythm. Exam reveals no gallop and no friction rub.  No murmur heard. Pulmonary/Chest: Effort normal and breath sounds normal. No respiratory distress. She has no wheezes. Right breast exhibits no mass, no skin change and no tenderness. Left breast exhibits no mass, no skin change and no tenderness.  Abdominal: Soft. Bowel sounds are normal. She exhibits no distension. There is no tenderness. There is no rebound.  Musculoskeletal: Normal range of motion.  Neurological: She is alert and oriented to person, place, and time. No cranial nerve deficit.  Skin: Skin is warm and dry.  Psychiatric: She has a normal mood and affect. Judgment normal.  Vitals reviewed.   ASSESSMENT/PLAN:   Mixed Incontinence, favor OAB Problem List Items Addressed This Visit      Genitourinary   Overactive bladder - Primary    Meds discussed as options Myrbetriq Rx and samples F/u 2 mos Info provided Bladder diary  Judy MajorPaul Massiah Minjares, MD, Merlinda FrederickFACOG [facility], [facility] 12/20/2017  4:30 PM

## 2017-12-22 LAB — HEP, RPR, HIV PANEL
HEP B S AG: NEGATIVE
HIV SCREEN 4TH GENERATION: NONREACTIVE
RPR: NONREACTIVE

## 2017-12-22 LAB — HSV(HERPES SMPLX)ABS-I+II(IGG+IGM)-BLD
HSV 1 Glycoprotein G Ab, IgG: <0.91 index (ref 0.00–0.90)
HSV 2 IgG, Type Spec: <0.91 index (ref 0.00–0.90)
HSVI/II Comb IgM: 1.46 Ratio — ABNORMAL HIGH (ref 0.00–0.90)

## 2017-12-22 NOTE — Progress Notes (Signed)
D/w pt.  Repeat testing 2 mos

## 2018-02-19 ENCOUNTER — Encounter: Payer: Self-pay | Admitting: Obstetrics & Gynecology

## 2018-02-20 ENCOUNTER — Ambulatory Visit (INDEPENDENT_AMBULATORY_CARE_PROVIDER_SITE_OTHER): Payer: BLUE CROSS/BLUE SHIELD | Admitting: Obstetrics & Gynecology

## 2018-02-20 ENCOUNTER — Encounter: Payer: Self-pay | Admitting: Obstetrics & Gynecology

## 2018-02-20 VITALS — BP 130/90 | HR 86 | Ht 65.5 in | Wt 257.0 lb

## 2018-02-20 DIAGNOSIS — Z1231 Encounter for screening mammogram for malignant neoplasm of breast: Secondary | ICD-10-CM | POA: Diagnosis not present

## 2018-02-20 DIAGNOSIS — Z113 Encounter for screening for infections with a predominantly sexual mode of transmission: Secondary | ICD-10-CM | POA: Diagnosis not present

## 2018-02-20 DIAGNOSIS — N3281 Overactive bladder: Secondary | ICD-10-CM | POA: Diagnosis not present

## 2018-02-20 DIAGNOSIS — Z1239 Encounter for other screening for malignant neoplasm of breast: Secondary | ICD-10-CM

## 2018-02-20 DIAGNOSIS — Z0000 Encounter for general adult medical examination without abnormal findings: Secondary | ICD-10-CM | POA: Diagnosis not present

## 2018-02-20 NOTE — Progress Notes (Signed)
HPI:      Judy Simmons is a 47 y.o. G1P1001 who LMP was No LMP recorded. Patient has had a hysterectomy., she presents today for her annual examination. The patient has improved OAB without meds.  Stress over HSV possibility due to prior testing. The patient is not currently sexually active. Her last pap: was normal. The patient does perform self breast exams.  There is no notable family history of breast or ovarian cancer in her family.  The patient has regular exercise: yes.  The patient denies current symptoms of depression.    Prior HSV + IgM test 2 mos ago.  Repeat testing today.  GYN History: Contraception: status post hysterectomy  PMHx: Past Medical History:  Diagnosis Date  . Asthma   . Condyloma acuminatum   . Depression   . GERD (gastroesophageal reflux disease)   . LGSIL on Pap smear of cervix   . Obesity    Past Surgical History:  Procedure Laterality Date  . HYSTEROSCOPY W/D&C  04/26/2011   simple hyperplasia, endometrial polyp  . LAPAROSCOPIC GASTRIC SLEEVE RESECTION  2012  . PUBOVAGINAL SLING  04/03/2012   using TVTdevice  . TONSILLECTOMY AND ADENOIDECTOMY  2000  . TOTAL VAGINAL HYSTERECTOMY  04/03/2012   uterine prolapse   Family History  Problem Relation Age of Onset  . Diabetes Father   . Colon cancer Father   . Alcohol abuse Daughter   . Drug abuse Daughter    Social History   Tobacco Use  . Smoking status: Current Every Day Smoker  . Smokeless tobacco: Never Used  Substance Use Topics  . Alcohol use: Yes  . Drug use: No    Current Outpatient Medications:  .  albuterol (PROVENTIL HFA;VENTOLIN HFA) 108 (90 Base) MCG/ACT inhaler, Inhale 1-2 puffs into the lungs every 6 (six) hours as needed for wheezing or shortness of breath., Disp: 1 Inhaler, Rfl: 0 .  Diclofenac Sodium (PENNSAID) 2 % SOLN, Place 1 application onto the skin 2 (two) times daily., Disp: 1 Bottle, Rfl: 3 .  mirabegron ER (MYRBETRIQ) 50 MG TB24 tablet, Take 1 tablet  (50 mg total) by mouth daily., Disp: 30 tablet, Rfl: 6 .  Multiple Vitamin (MULTIVITAMIN) tablet, Take 1 tablet by mouth daily., Disp: , Rfl:  .  terbinafine (LAMISIL) 250 MG tablet, Take 250 mg by mouth once., Disp: , Rfl:  .  traMADol (ULTRAM) 50 MG tablet, , Disp: , Rfl: 0 Allergies: Penicillin g  Review of Systems  Constitutional: Negative for chills, fever and malaise/fatigue.  HENT: Negative for congestion, sinus pain and sore throat.   Eyes: Negative for blurred vision and pain.  Respiratory: Negative for cough and wheezing.   Cardiovascular: Negative for chest pain and leg swelling.  Gastrointestinal: Negative for abdominal pain, constipation, diarrhea, heartburn, nausea and vomiting.  Genitourinary: Negative for dysuria, frequency, hematuria and urgency.  Musculoskeletal: Negative for back pain, joint pain, myalgias and neck pain.  Skin: Negative for itching and rash.  Neurological: Negative for dizziness, tremors and weakness.  Endo/Heme/Allergies: Does not bruise/bleed easily.  Psychiatric/Behavioral: Negative for depression. The patient is not nervous/anxious and does not have insomnia.     Objective: BP 130/90   Pulse 86   Ht 5' 5.5" (1.664 m)   Wt 257 lb (116.6 kg)   BMI 42.12 kg/m   Filed Weights   02/20/18 1523  Weight: 257 lb (116.6 kg)   Body mass index is 42.12 kg/m. Physical Exam  Constitutional: She is oriented  to person, place, and time. She appears well-developed and well-nourished. No distress.  Genitourinary: Rectum normal and vagina normal. Pelvic exam was performed with patient supine. There is no rash or lesion on the right labia. There is no rash or lesion on the left labia. Vagina exhibits no lesion. No bleeding in the vagina. Right adnexum does not display mass and does not display tenderness. Left adnexum does not display mass and does not display tenderness.  Genitourinary Comments: Absent Uterus Absent cervix Vaginal cuff well healed  HENT:    Head: Normocephalic and atraumatic. Head is without laceration.  Right Ear: Hearing normal.  Left Ear: Hearing normal.  Nose: No epistaxis.  No foreign bodies.  Mouth/Throat: Uvula is midline, oropharynx is clear and moist and mucous membranes are normal.  Eyes: Pupils are equal, round, and reactive to light.  Neck: Normal range of motion. Neck supple. No thyromegaly present.  Cardiovascular: Normal rate and regular rhythm. Exam reveals no gallop and no friction rub.  No murmur heard. Pulmonary/Chest: Effort normal and breath sounds normal. No respiratory distress. She has no wheezes. Right breast exhibits no mass, no skin change and no tenderness. Left breast exhibits no mass, no skin change and no tenderness.  Abdominal: Soft. Bowel sounds are normal. She exhibits no distension. There is no tenderness. There is no rebound.  Musculoskeletal: Normal range of motion.  Neurological: She is alert and oriented to person, place, and time. No cranial nerve deficit.  Skin: Skin is warm and dry.  Psychiatric: She has a normal mood and affect. Judgment normal.  Vitals reviewed.  Assessment:  ANNUAL EXAM 1. Screen for STD (sexually transmitted disease)   2. Annual physical exam   3. Screening for breast cancer   4. Morbid obesity (HCC)   5. Overactive bladder    Screening Plan:            1.  Cervical Screening-  Pap smear schedule reviewed with patient  2. Breast screening- Exam annually and mammogram>40 planned   3. Colonoscopy every 10 years, Hemoccult testing - after age 37  4. Labs managed by PCP  5. Counseling for contraception: none needed  Other:  1. Screen for STD (sexually transmitted disease) - HSV(herpes smplx)abs-1+2(IgG+IgM)-bld - HEP, RPR, HIV Panel - GC/Chlamydia Probe Amp Concerns over HSV discussed Pt may want suppressive dosing (Valtex)  2. Annual physical exam  3. Screening for breast cancer - MM DIGITAL SCREENING BILATERAL; Future  4. Morbid obesity  (HCC) Weight loss counseled  5. Overactive bladder No meds at this time Myrbetriq for future consideration      F/U  Return in about 1 year (around 02/21/2019) for Annual.  Annamarie Major, MD, [facility], [facility] 02/20/2018  3:59 PM

## 2018-02-20 NOTE — Patient Instructions (Signed)
PAP every 5 years Mammogram every year    Call [phone] to schedule at [facility] yearly (with PCP)   

## 2018-02-22 LAB — HSV(HERPES SMPLX)ABS-I+II(IGG+IGM)-BLD
HSV 1 Glycoprotein G Ab, IgG: <0.91 index (ref 0.00–0.90)
HSVI/II COMB AB IGM: 1.39 ratio — AB (ref 0.00–0.90)

## 2018-02-22 LAB — HEP, RPR, HIV PANEL
HEP B S AG: NEGATIVE
HIV Screen 4th Generation wRfx: NONREACTIVE
RPR: NONREACTIVE

## 2018-02-22 LAB — GC/CHLAMYDIA PROBE AMP
Chlamydia trachomatis, NAA: NEGATIVE
NEISSERIA GONORRHOEAE BY PCR: NEGATIVE

## 2018-02-23 ENCOUNTER — Telehealth: Payer: Self-pay | Admitting: Obstetrics & Gynecology

## 2018-02-23 ENCOUNTER — Encounter: Payer: Self-pay | Admitting: Obstetrics & Gynecology

## 2018-02-23 NOTE — Telephone Encounter (Signed)
Patient is requesting lab results. Please advise.

## 2018-02-23 NOTE — Telephone Encounter (Signed)
Patient is requesting you call work Number [phone] after 1pm please

## 2018-02-23 NOTE — Telephone Encounter (Signed)
Please advise 

## 2018-02-26 ENCOUNTER — Encounter: Payer: Self-pay | Admitting: Obstetrics & Gynecology

## 2018-02-26 ENCOUNTER — Other Ambulatory Visit: Payer: Self-pay | Admitting: Obstetrics & Gynecology

## 2018-02-26 DIAGNOSIS — R899 Unspecified abnormal finding in specimens from other organs, systems and tissues: Secondary | ICD-10-CM

## 2018-02-26 MED ORDER — VALACYCLOVIR HCL 500 MG PO TABS: 500.0000 mg | ORAL_TABLET | Freq: Every day | ORAL | 11 refills | Status: AC

## 2018-03-22 ENCOUNTER — Encounter: Payer: Self-pay | Admitting: Obstetrics & Gynecology

## 2018-03-23 ENCOUNTER — Encounter: Payer: Self-pay | Admitting: Obstetrics and Gynecology

## 2018-03-23 ENCOUNTER — Ambulatory Visit: Payer: BLUE CROSS/BLUE SHIELD | Admitting: Obstetrics and Gynecology

## 2018-03-23 VITALS — BP 124/84 | HR 69 | Ht 65.5 in | Wt 260.0 lb

## 2018-03-23 DIAGNOSIS — B009 Herpesviral infection, unspecified: Secondary | ICD-10-CM

## 2018-03-23 DIAGNOSIS — Z1159 Encounter for screening for other viral diseases: Secondary | ICD-10-CM | POA: Diagnosis not present

## 2018-03-23 DIAGNOSIS — N9089 Other specified noninflammatory disorders of vulva and perineum: Secondary | ICD-10-CM

## 2018-03-23 NOTE — Patient Instructions (Signed)
I value your feedback and entrusting us with your care. If you get a [facility] patient survey, I would appreciate you taking the time to let us know about your experience today. Thank you! 

## 2018-03-23 NOTE — Progress Notes (Signed)
Allegra GranaArnett, Margaret G, FNP       Chief Complaint  Patient presents with  . Vaginitis    Pain, and itching on outside of vagina     HPI:      Ms. Cicero DuckJennifer M Penaloza is a 47 y.o. G1P1001 who LMP was No LMP recorded. Patient has had a hysterectomy., presents today for painful vaginal sore since today. She has a hx of ingrown hairs after waxing but she hasn't waxed in a couple wks. Ingrown hairs haven't been as painful either. No increased vag d/c, odor, irritation. No fevers, LBP, belly pain. No new soaps/detergents. She is sex active with newer partner but  had neg STD testing 3/19. She tested pos for HSV I/II IgM 1/19 and 3/19 but neg HSV 2 IgG. She has repeat IgG testing in 6/19. No hx of herpes, but started on valtrex 500 mg daily as preventive. Currently taking BID for the past few days due to possible HSV lesion.        Past Medical History:  Diagnosis Date  . Asthma   . Condyloma acuminatum   . Depression   . GERD (gastroesophageal reflux disease)   . LGSIL on Pap smear of cervix   . Obesity          Past Surgical History:  Procedure Laterality Date  . HYSTEROSCOPY W/D&C  04/26/2011   simple hyperplasia, endometrial polyp  . LAPAROSCOPIC GASTRIC SLEEVE RESECTION  2012  . PUBOVAGINAL SLING  04/03/2012   using TVTdevice  . TONSILLECTOMY AND ADENOIDECTOMY  2000  . TOTAL VAGINAL HYSTERECTOMY  04/03/2012   uterine prolapse         Family History  Problem Relation Age of Onset  . Diabetes Father   . Colon cancer Father   . Alcohol abuse Daughter   . Drug abuse Daughter     Social History        Socioeconomic History  . Marital status: Single    Spouse name: Not on file  . Number of children: 1  . Years of education: 5214  . Highest education level: Not on file  Occupational History  . Occupation: Clinical biochemistcustomer service  Social Needs  . Financial resource strain: Not on file  . Food insecurity:    Worry: Not on file     Inability: Not on file  . Transportation needs:    Medical: Not on file    Non-medical: Not on file  Tobacco Use  . Smoking status: Current Every Day Smoker  . Smokeless tobacco: Never Used  Substance and Sexual Activity  . Alcohol use: Yes  . Drug use: No  . Sexual activity: Yes    Birth control/protection: Surgical    Comment: Hysterectomy   Lifestyle  . Physical activity:    Days per week: Not on file    Minutes per session: Not on file  . Stress: Not on file  Relationships  . Social connections:    Talks on phone: Not on file    Gets together: Not on file    Attends religious service: Not on file    Active member of club or organization: Not on file    Attends meetings of clubs or organizations: Not on file    Relationship status: Not on file  . Intimate partner violence:    Fear of current or ex partner: Not on file    Emotionally abused: Not on file    Physically abused: Not on file    Forced  sexual activity: Not on file  Other Topics Concern  . Not on file  Social History Narrative   Lives in [place]      Works- Health visitor      Living with parents      One daughter who has grandson             Outpatient Medications Prior to Visit  Medication Sig Dispense Refill  . albuterol (PROVENTIL HFA;VENTOLIN HFA) 108 (90 Base) MCG/ACT inhaler Inhale 1-2 puffs into the lungs every 6 (six) hours as needed for wheezing or shortness of breath. 1 Inhaler 0  . valACYclovir (VALTREX) 500 MG tablet Take 1 tablet (500 mg total) by mouth daily. 30 tablet 11  . Diclofenac Sodium (PENNSAID) 2 % SOLN Place 1 application onto the skin 2 (two) times daily. (Patient not taking: Reported on 03/23/2018) 1 Bottle 3  . mirabegron ER (MYRBETRIQ) 50 MG TB24 tablet Take 1 tablet (50 mg total) by mouth daily. (Patient not taking: Reported on 03/23/2018) 30 tablet 6  . Multiple Vitamin (MULTIVITAMIN) tablet Take 1 tablet by mouth daily.     Marland Kitchen terbinafine (LAMISIL) 250 MG tablet Take 250 mg by mouth once.    . traMADol (ULTRAM) 50 MG tablet   0   No facility-administered medications prior to visit.     ROS:  Review of Systems  Constitutional: Negative for fever.  Gastrointestinal: Negative for blood in stool, constipation, diarrhea, nausea and vomiting.  Genitourinary: Positive for genital sores. Negative for dyspareunia, dysuria, flank pain, frequency, hematuria, urgency, vaginal bleeding, vaginal discharge and vaginal pain.  Musculoskeletal: Negative for back pain.  Skin: Negative for rash.    OBJECTIVE:   Vitals:  BP 124/84   Pulse 69   Ht 5' 5.5" (1.664 m)   Wt 260 lb (117.9 kg)   BMI 42.61 kg/m   Physical Exam  Constitutional: She is oriented to person, place, and time. She appears well-developed.  Genitourinary: There is no rash, tenderness, lesion or injury on the right labia. There is no rash, tenderness, lesion or injury on the left labia.  Genitourinary Comments: RT MONS WITH FIRM, TENDER NODULE/MILD ABSCESS; NO D/C;   Pulmonary/Chest: Effort normal.  Musculoskeletal: Normal range of motion.  Neurological: She is alert and oriented to person, place, and time.  Psychiatric: She has a normal mood and affect. Her behavior is normal. Judgment and thought content normal.    Assessment/Plan: Vulvar lesion - Most likely furuncle from ingrown hair. No need for abx/ nothing to I&D. Warm compresses. Reassurance. F/u prn.   Polymerase chain reaction DNA test positive for herpes simplex virus type 1 (HSV-1) - HSV and labs discussed. Most likely is HSV 2 neg and HSV 1 positive from previous labs. Has repeat HSV 2 IgG 6/19. If neg, suggested d/c valtrex. F/u prn.               Return if symptoms worsen or fail to improve.   Alicia B. Copland, PA-C 03/23/2018 11:24 AM

## 2018-03-23 NOTE — Progress Notes (Deleted)
Allegra GranaArnett, Margaret G, FNP   Chief Complaint  Patient presents with  . Vaginitis    Pain, and itching on outside of vagina     HPI:      Ms. Cicero DuckJennifer M Verner is a 47 y.o. G1P1001 who LMP was No LMP recorded. Patient has had a hysterectomy., presents today for ***    Past Medical History:  Diagnosis Date  . Asthma   . Condyloma acuminatum   . Depression   . GERD (gastroesophageal reflux disease)   . LGSIL on Pap smear of cervix   . Obesity     Past Surgical History:  Procedure Laterality Date  . HYSTEROSCOPY W/D&C  04/26/2011   simple hyperplasia, endometrial polyp  . LAPAROSCOPIC GASTRIC SLEEVE RESECTION  2012  . PUBOVAGINAL SLING  04/03/2012   using TVTdevice  . TONSILLECTOMY AND ADENOIDECTOMY  2000  . TOTAL VAGINAL HYSTERECTOMY  04/03/2012   uterine prolapse    Family History  Problem Relation Age of Onset  . Diabetes Father   . Colon cancer Father   . Alcohol abuse Daughter   . Drug abuse Daughter     Social History   Socioeconomic History  . Marital status: Single    Spouse name: Not on file  . Number of children: 1  . Years of education: 514  . Highest education level: Not on file  Occupational History  . Occupation: Clinical biochemistcustomer service  Social Needs  . Financial resource strain: Not on file  . Food insecurity:    Worry: Not on file    Inability: Not on file  . Transportation needs:    Medical: Not on file    Non-medical: Not on file  Tobacco Use  . Smoking status: Current Every Day Smoker  . Smokeless tobacco: Never Used  Substance and Sexual Activity  . Alcohol use: Yes  . Drug use: No  . Sexual activity: Yes    Birth control/protection: Surgical    Comment: Hysterectomy   Lifestyle  . Physical activity:    Days per week: Not on file    Minutes per session: Not on file  . Stress: Not on file  Relationships  . Social connections:    Talks on phone: Not on file    Gets together: Not on file    Attends religious service: Not on  file    Active member of club or organization: Not on file    Attends meetings of clubs or organizations: Not on file    Relationship status: Not on file  . Intimate partner violence:    Fear of current or ex partner: Not on file    Emotionally abused: Not on file    Physically abused: Not on file    Forced sexual activity: Not on file  Other Topics Concern  . Not on file  Social History Narrative   Lives in [place]      Works- Health visitorinsurance and retail      Living with parents      One daughter who has grandson       Outpatient Medications Prior to Visit  Medication Sig Dispense Refill  . albuterol (PROVENTIL HFA;VENTOLIN HFA) 108 (90 Base) MCG/ACT inhaler Inhale 1-2 puffs into the lungs every 6 (six) hours as needed for wheezing or shortness of breath. 1 Inhaler 0  . valACYclovir (VALTREX) 500 MG tablet Take 1 tablet (500 mg total) by mouth daily. 30 tablet 11  . Diclofenac Sodium (PENNSAID) 2 % SOLN Place 1  application onto the skin 2 (two) times daily. (Patient not taking: Reported on 03/23/2018) 1 Bottle 3  . mirabegron ER (MYRBETRIQ) 50 MG TB24 tablet Take 1 tablet (50 mg total) by mouth daily. (Patient not taking: Reported on 03/23/2018) 30 tablet 6  . Multiple Vitamin (MULTIVITAMIN) tablet Take 1 tablet by mouth daily.    Marland Kitchen terbinafine (LAMISIL) 250 MG tablet Take 250 mg by mouth once.    . traMADol (ULTRAM) 50 MG tablet   0   No facility-administered medications prior to visit.       ROS:  Review of Systems BREAST: No symptoms   OBJECTIVE:   Vitals:  BP 124/84   Pulse 69   Ht 5' 5.5" (1.664 m)   Wt 260 lb (117.9 kg)   BMI 42.61 kg/m   Physical Exam  Results: No results found for this or any previous visit (from the past 24 hour(s)).   Assessment/Plan: No diagnosis found.    No orders of the defined types were placed in this encounter.     No follow-ups on file.  Greely Atiyeh B. Milagro Belmares, PA-C 03/23/2018 11:24 AM

## 2018-05-01 ENCOUNTER — Other Ambulatory Visit: Payer: BLUE CROSS/BLUE SHIELD

## 2018-05-01 DIAGNOSIS — R899 Unspecified abnormal finding in specimens from other organs, systems and tissues: Secondary | ICD-10-CM

## 2018-05-03 ENCOUNTER — Telehealth: Payer: Self-pay | Admitting: Obstetrics & Gynecology

## 2018-05-03 ENCOUNTER — Encounter: Payer: Self-pay | Admitting: Obstetrics & Gynecology

## 2018-05-03 LAB — HSV(HERPES SMPLX)ABS-I+II(IGG+IGM)-BLD
HSV 1 Glycoprotein G Ab, IgG: <0.91 index (ref 0.00–0.90)
HSV 2 IgG, Type Spec: <0.91 index (ref 0.00–0.90)
HSVI/II COMB AB IGM: 1.11 ratio — AB (ref 0.00–0.90)

## 2018-05-03 NOTE — Telephone Encounter (Signed)
Patient is follow ing up on last message left for lab results

## 2018-05-03 NOTE — Telephone Encounter (Signed)
Patient is calling for labs results. Please advise. [phone] . 12-1 pm please call cellphone

## 2018-05-04 ENCOUNTER — Other Ambulatory Visit: Payer: Self-pay | Admitting: Obstetrics & Gynecology

## 2018-05-04 DIAGNOSIS — R899 Unspecified abnormal finding in specimens from other organs, systems and tissues: Secondary | ICD-10-CM

## 2018-05-07 NOTE — Telephone Encounter (Signed)
Have you spoke with pt ? 

## 2018-05-08 ENCOUNTER — Encounter: Payer: Self-pay | Admitting: Obstetrics & Gynecology

## 2018-05-22 DIAGNOSIS — H5213 Myopia, bilateral: Secondary | ICD-10-CM | POA: Diagnosis not present

## 2018-05-29 ENCOUNTER — Telehealth: Payer: Self-pay

## 2018-05-29 NOTE — Telephone Encounter (Signed)
Left message to make pt aware her mammo is past due,

## 2018-05-29 NOTE — Telephone Encounter (Signed)
-----   Message from Nadara Mustardobert P Harris, MD sent at 05/28/2018  6:50 AM EDT ----- Regarding: mMG Received notice she has not received MMG yet as ordered at her Annual. Please check and encourage her to do this, and document conversation.

## 2018-06-23 ENCOUNTER — Encounter: Payer: Self-pay | Admitting: Obstetrics & Gynecology

## 2018-06-26 ENCOUNTER — Other Ambulatory Visit: Payer: BLUE CROSS/BLUE SHIELD

## 2018-06-26 DIAGNOSIS — R899 Unspecified abnormal finding in specimens from other organs, systems and tissues: Secondary | ICD-10-CM

## 2018-06-28 LAB — HSV(HERPES SIMPLEX VRS) I + II AB-IGG: HSV 1 Glycoprotein G Ab, IgG: <0.91 index (ref 0.00–0.90)

## 2018-06-28 LAB — HSV(HERPES SIMPLEX VRS) I + II AB-IGM: HSVI/II Comb IgM: 1.24 Ratio — ABNORMAL HIGH (ref 0.00–0.90)

## 2018-07-04 ENCOUNTER — Telehealth: Payer: Self-pay

## 2018-07-04 NOTE — Telephone Encounter (Signed)
Pt called triage asking for ABC to give her a call on Monday she is aware she is on vacation and can wait to talk to her until then

## 2018-07-09 NOTE — Telephone Encounter (Signed)
LMTRC

## 2018-07-12 ENCOUNTER — Encounter: Payer: Self-pay | Admitting: Obstetrics & Gynecology

## 2018-07-12 NOTE — Telephone Encounter (Signed)
Called and left voice mail for patient to call back to be schedule °

## 2018-07-12 NOTE — Telephone Encounter (Signed)
Can you call pt and schedule with AMS

## 2018-07-27 ENCOUNTER — Ambulatory Visit: Payer: BLUE CROSS/BLUE SHIELD | Admitting: Obstetrics and Gynecology

## 2018-07-27 ENCOUNTER — Ambulatory Visit: Payer: BLUE CROSS/BLUE SHIELD | Admitting: Family

## 2018-07-27 ENCOUNTER — Ambulatory Visit
Admission: RE | Admit: 2018-07-27 | Discharge: 2018-07-27 | Disposition: A | Discharge status: Home / Self Care | Payer: BLUE CROSS/BLUE SHIELD | Source: Ambulatory Visit | Attending: Family | Admitting: Family

## 2018-07-27 ENCOUNTER — Encounter: Payer: Self-pay | Admitting: Obstetrics and Gynecology

## 2018-07-27 ENCOUNTER — Encounter: Payer: Self-pay | Admitting: Family

## 2018-07-27 VITALS — BP 110/84 | HR 76 | Temp 98.6°F | Resp 16 | Wt 260.5 lb

## 2018-07-27 VITALS — BP 112/72 | HR 87 | Ht 65.5 in | Wt 260.0 lb

## 2018-07-27 DIAGNOSIS — Z6841 Body Mass Index (BMI) 40.0 and over, adult: Secondary | ICD-10-CM

## 2018-07-27 DIAGNOSIS — B353 Tinea pedis: Secondary | ICD-10-CM

## 2018-07-27 DIAGNOSIS — Z131 Encounter for screening for diabetes mellitus: Secondary | ICD-10-CM | POA: Diagnosis not present

## 2018-07-27 DIAGNOSIS — L0390 Cellulitis, unspecified: Secondary | ICD-10-CM | POA: Insufficient documentation

## 2018-07-27 DIAGNOSIS — Z1329 Encounter for screening for other suspected endocrine disorder: Secondary | ICD-10-CM | POA: Diagnosis not present

## 2018-07-27 DIAGNOSIS — Z13220 Encounter for screening for lipoid disorders: Secondary | ICD-10-CM | POA: Diagnosis not present

## 2018-07-27 DIAGNOSIS — L03116 Cellulitis of left lower limb: Secondary | ICD-10-CM | POA: Diagnosis not present

## 2018-07-27 MED ORDER — INSULIN PEN NEEDLE 32G X 6 MM MISC: 1.0000 [IU] | Freq: Every day | 3 refills | Status: DC

## 2018-07-27 MED ORDER — LIRAGLUTIDE -WEIGHT MANAGEMENT 18 MG/3ML ~~LOC~~ SOPN: 3.0000 mg | PEN_INJECTOR | Freq: Every day | SUBCUTANEOUS | 2 refills | Status: DC

## 2018-07-27 MED ORDER — CLOTRIMAZOLE 1 % EX CREA: 1.0000 "application " | TOPICAL_CREAM | Freq: Two times a day (BID) | CUTANEOUS | 1 refills | Status: DC

## 2018-07-27 MED ORDER — DOXYCYCLINE HYCLATE 100 MG PO TBEC: 100.0000 mg | DELAYED_RELEASE_TABLET | Freq: Two times a day (BID) | ORAL | 0 refills | Status: DC

## 2018-07-27 NOTE — Patient Instructions (Signed)
Start doxycycline.  Avoid sun as discussed  Ultrasound today  Clomitrazole for suspected athlete's foot  Please stay vigilant and let us know if not better   Cellulitis, Adult Cellulitis is a skin infection. The infected area is usually red and sore. This condition occurs most often in the arms and lower legs. It is very important to get treated for this condition. Follow these instructions at home:  Take over-the-counter and prescription medicines only as told by your doctor.  If you were prescribed an antibiotic medicine, take it as told by your doctor. Do not stop taking the antibiotic even if you start to feel better.  Drink enough fluid to keep your pee (urine) clear or pale yellow.  Do not touch or rub the infected area.  Raise (elevate) the infected area above the level of your heart while you are sitting or lying down.  Place warm or cold wet cloths (warm or cold compresses) on the infected area. Do this as told by your doctor.  Keep all follow-up visits as told by your doctor. This is important. These visits let your doctor make sure your infection is not getting worse. Contact a doctor if:  You have a fever.  Your symptoms do not get better after 1-2 days of treatment.  Your bone or joint under the infected area starts to hurt after the skin has healed.  Your infection comes back. This can happen in the same area or another area.  You have a swollen bump in the infected area.  You have new symptoms.  You feel ill and also have muscle aches and pains. Get help right away if:  Your symptoms get worse.  You feel very sleepy.  You throw up (vomit) or have watery poop (diarrhea) for a long time.  There are red streaks coming from the infected area.  Your red area gets larger.  Your red area turns darker. This information is not intended to replace advice given to you by your health care provider. Make sure you discuss any questions you have with your health  care provider. Document Released: 05/02/2008 Document Revised: 04/21/2016 Document Reviewed: 09/23/2015 Elsevier Interactive Patient Education  2018 [employer]

## 2018-07-27 NOTE — Progress Notes (Signed)
Gynecology Office Visit  Chief Complaint:  Chief Complaint  Patient presents with  . Weight loss consult    History of Present Illness: Patientis a 47 y.o. G31P1001 female, who presents for the evaluation of weight gain. She has gained 25 pounds primarily over 24 months. The patient states the following issues have contributed to her weight problem: none identified.  The patient has no additional symptoms. The patient specifically denies memory loss, muscle weakness, excessive thirst, and polyuria. Weight related co-morbidities include none. The patient's past medical history is notable for gastric bypass (sleeve). She has tried phentermine interventions in the past with no success.   Review of Systems: 10 point review of systems negative unless otherwise noted in HPI  Past Medical History:  Past Medical History:  Diagnosis Date  . Asthma   . Condyloma acuminatum   . Depression   . GERD (gastroesophageal reflux disease)   . LGSIL on Pap smear of cervix   . Obesity     Past Surgical History:  Past Surgical History:  Procedure Laterality Date  . HYSTEROSCOPY W/D&C  04/26/2011   simple hyperplasia, endometrial polyp  . LAPAROSCOPIC GASTRIC SLEEVE RESECTION  2012  . PUBOVAGINAL SLING  04/03/2012   using TVTdevice  . TONSILLECTOMY AND ADENOIDECTOMY  2000  . TOTAL VAGINAL HYSTERECTOMY  04/03/2012   uterine prolapse    Gynecologic History: No LMP recorded. Patient has had a hysterectomy.  Obstetric History: G1P1001  Family History:  Family History  Problem Relation Age of Onset  . Diabetes Father   . Colon cancer Father   . Alcohol abuse Daughter   . Drug abuse Daughter     Social History:  Social History   Socioeconomic History  . Marital status: Single    Spouse name: Not on file  . Number of children: 1  . Years of education: 82  . Highest education level: Not on file  Occupational History  . Occupation: Clinical biochemist  Social Needs  . Financial resource  strain: Not on file  . Food insecurity:    Worry: Not on file    Inability: Not on file  . Transportation needs:    Medical: Not on file    Non-medical: Not on file  Tobacco Use  . Smoking status: Current Every Day Smoker  . Smokeless tobacco: Never Used  Substance and Sexual Activity  . Alcohol use: Yes  . Drug use: No  . Sexual activity: Yes    Birth control/protection: Surgical    Comment: Hysterectomy   Lifestyle  . Physical activity:    Days per week: Not on file    Minutes per session: Not on file  . Stress: Not on file  Relationships  . Social connections:    Talks on phone: Not on file    Gets together: Not on file    Attends religious service: Not on file    Active member of club or organization: Not on file    Attends meetings of clubs or organizations: Not on file    Relationship status: Not on file  . Intimate partner violence:    Fear of current or ex partner: Not on file    Emotionally abused: Not on file    Physically abused: Not on file    Forced sexual activity: Not on file  Other Topics Concern  . Not on file  Social History Narrative   Lives in [place]      Works- Health visitor  Living with parents      One daughter who has grandson       Allergies:  Allergies  Allergen Reactions  . Nsaids Other (See Comments)    Gastric Sleeve surgery  . Penicillin G Other (See Comments)    Rxn as a Child    Medications: Prior to Admission medications   Medication Sig Start Date End Date Taking? Authorizing Provider  albuterol (PROVENTIL HFA;VENTOLIN HFA) 108 (90 Base) MCG/ACT inhaler Inhale 1-2 puffs into the lungs every 6 (six) hours as needed for wheezing or shortness of breath. 11/27/17  Yes Nche, Bonna Gainsharlotte Lum, NP  valACYclovir (VALTREX) 500 MG tablet Take 500 mg by mouth 2 (two) times daily.   Yes [provider]    Physical Exam Blood pressure 112/72, pulse 87, height 5' 5.5" (1.664 m), weight 260 lb (117.9 kg). Body  mass index is 42.61 kg/m.  No LMP recorded. Patient has had a hysterectomy.  General: NAD HEENT: normocephalic, anicteric Thyroid: no enlargement Pulmonary: no increased work of breathing Neurologic: Grossly intact Psychiatric: mood appropriate, affect full  Assessment: 47 y.o. G1P1001 presenting for discussion of weight loss management options  Plan: Problem List Items Addressed This Visit      Other   Obesity - Primary   Relevant Medications   Liraglutide -Weight Management (SAXENDA) 18 MG/3ML SOPN   Other Relevant Orders   TSH   Hemoglobin A1c   Lipid panel    Other Visit Diagnoses    Thyroid disorder screening       Relevant Orders   TSH   Lipid panel   Screening cholesterol level       Relevant Orders   Lipid panel   Screening for diabetes mellitus       Relevant Orders   Hemoglobin A1c      1) 1500 Calorie ADA Diet  2) Patient education given regarding appropriate lifestyle changes for weight loss including: regular physical activity, healthy coping strategies, caloric restriction and healthy eating patterns.  3) Patient will be started on weight loss medication. The risks and benefits and side effects of medication, such as Adipex (Phenteramine) ,  Tenuate (Diethylproprion), Belviq (lorcarsin), Contrave (buproprion/naltrexone), Qsymia (phentermine/topiramate), and Saxenda (liraglutide) is discussed. The pros and cons of suppressing appetite and boosting metabolism is discussed. Risks of tolerence and addiction is discussed for selected agents discussed. Use of medicine will ne short term, such as 3-4 months at a time followed by a period of time off of the medicine to avoid these risks and side effects for Adipex, Qsymia, and Tenuate discussed. Pt to call with any negative side effects and agrees to keep follow up appts. - start Saxenda - has had previously tried and failed phentermine  4) Comorbidity Screening - hypothyroidism screening, diabetes, and  hyperlipidemia screening ordered - The patient is status post prior gastric sleeve surgery.  We discussed that this complicated and potentially may make her more treatment refractory to medical weight loss given the associated changes in her basal metabolism.  We discussed multimodal approach.  She does have a history of depression and we discussed potentially treating any co-morbidities with weight loss promoting drugs as well such as Wellbutrin.  My recommendation is to start on medicine at a time to ensure appropriate side-effect profile  5) Encouraged weekly weight monitorig to track progress  6) Contraception - N/A s/p prior hysterectomy  7) 25 minutes face-to-face; counseling/coordination of care > 50 percent of visit  8) Return in about 3 months (around  10/27/2018).   Vena Austria, MD, [facility], [facility] 07/27/2018, 10:25 AM

## 2018-07-27 NOTE — Progress Notes (Signed)
Subjective:    Patient ID: Judy Simmons, female    DOB: 06-16-1971, 47 y.o.   MRN: [id number]  CC: Judy Simmons is a 47 y.o. female who presents today for an acute visit.    HPI: CC: left leg swelling, redness 5 days ago had left knee pain, and left ankle 'feels like ankle on fire.'  H/o cellulitis 4 years ago. No h/o mrsa though she is not sure.   Works Statistician.  No malignancy, recent immobilization, travel. No sob, cp.      HISTORY:  Past Medical History:  Diagnosis Date  . Asthma   . Condyloma acuminatum   . Depression   . GERD (gastroesophageal reflux disease)   . LGSIL on Pap smear of cervix   . Obesity    Past Surgical History:  Procedure Laterality Date  . HYSTEROSCOPY W/D&C  04/26/2011   simple hyperplasia, endometrial polyp  . LAPAROSCOPIC GASTRIC SLEEVE RESECTION  2012  . PUBOVAGINAL SLING  04/03/2012   using TVTdevice  . TONSILLECTOMY AND ADENOIDECTOMY  2000  . TOTAL VAGINAL HYSTERECTOMY  04/03/2012   uterine prolapse   Family History  Problem Relation Age of Onset  . Diabetes Father   . Colon cancer Father   . Alcohol abuse Daughter   . Drug abuse Daughter     Allergies: Nsaids and Penicillin g Current Outpatient Medications on File Prior to Visit  Medication Sig Dispense Refill  . albuterol (PROVENTIL HFA;VENTOLIN HFA) 108 (90 Base) MCG/ACT inhaler Inhale 1-2 puffs into the lungs every 6 (six) hours as needed for wheezing or shortness of breath. 1 Inhaler 0  . Insulin Pen Needle (NOVOFINE) 32G X 6 MM MISC 1 Units by Does not apply route daily. 100 each 3  . Liraglutide -Weight Management (SAXENDA) 18 MG/3ML SOPN Inject 3 mg into the skin daily. 5 pen 2  . valACYclovir (VALTREX) 500 MG tablet Take 500 mg by mouth 2 (two) times daily.     No current facility-administered medications on file prior to visit.     Social History   Tobacco Use  . Smoking status: Current Every Day Smoker  . Smokeless tobacco: Never Used    Substance Use Topics  . Alcohol use: Yes  . Drug use: No    Review of Systems  Constitutional: Negative for chills and fever.  Respiratory: Negative for cough and shortness of breath.   Cardiovascular: Negative for chest pain, palpitations and leg swelling.  Gastrointestinal: Negative for nausea and vomiting.  Skin: Positive for color change and rash.      Objective:    BP 110/84 (BP Location: Left Arm, Patient Position: Sitting, Cuff Size: Normal)   Pulse 76   Temp 98.6 F (37 C) (Oral)   Resp 16   Wt 260 lb 8 oz (118.2 kg)   SpO2 97%   BMI 42.69 kg/m    Physical Exam  Constitutional: She appears well-developed and well-nourished.  Eyes: Conjunctivae are normal.  Cardiovascular: Normal rate, regular rhythm, normal heart sounds and normal pulses.  No LE edema, palpable cords or masses. There is erythema and increased warmth of LLE. No gross asymmetry in calf size when compared bilaterally LE hair growth symmetric and present.  LE warm and palpable pedal pulses.   Pulmonary/Chest: Effort normal and breath sounds normal. She has no wheezes. She has no rhonchi. She has no rales.  Neurological: She is alert.  Skin: Skin is warm and dry.     Erythema, increased warmth left  lateral calf.   Erythematous serpiginous rash noted right dorsal aspect of foot ; cracking noted between toes  Psychiatric: She has a normal mood and affect. Her speech is normal and behavior is normal. Thought content normal.  Vitals reviewed.      Assessment & Plan:  1. Athlete's foot on right Symptoms consistent with fungal infection. Patient to let me know if not better - clotrimazole (LOTRIMIN) 1 % cream; Apply 1 application topically 2 (two) times daily.  Dispense: 30 g; Refill: 1  2. Cellulitis, unspecified cellulitis site Symptoms consistent with non purulent cellulitis. PCN allergic. Start doxycycline. Close vigilance. Pending US left to ensure no DVT.   - doxycycline (DORYX) 100 MG EC  tablet; Take 1 tablet (100 mg total) by mouth 2 (two) times daily.  Dispense: 20 tablet; Refill: 0 - US Venous Img Lower Unilateral Left    I am having Judy Simmons "[facility]" start on clotrimazole and doxycycline. I am also having her maintain her albuterol, valACYclovir, Liraglutide -Weight Management, and Insulin Pen Needle.   Meds ordered this encounter  Medications  . clotrimazole (LOTRIMIN) 1 % cream    Sig: Apply 1 application topically 2 (two) times daily.    Dispense:  30 g    Refill:  1    Order Specific Question:   Supervising Provider    Answer:   Darrick HuntsmanULLO, TERESA L [[id number]]  . doxycycline (DORYX) 100 MG EC tablet    Sig: Take 1 tablet (100 mg total) by mouth 2 (two) times daily.    Dispense:  20 tablet    Refill:  0    Order Specific Question:   Supervising Provider    Answer:   Sherlene ShamsULLO, TERESA L [[id number]]    Return precautions given.   Risks, benefits, and alternatives of the medications and treatment plan prescribed today were discussed, and patient expressed understanding.   Education regarding symptom management and diagnosis given to patient on AVS.  Continue to follow with Allegra GranaArnett, Margaret G, FNP for routine health maintenance.   Judy DuckJennifer M Hanisch and I agreed with plan.   Rennie PlowmanMargaret Arnett, FNP

## 2018-07-27 NOTE — Patient Instructions (Signed)
FAST FACTS . Body Mass Index (BMI) is one measurement that your doctor may use to discuss your weight . BMI is an estimate of body fat. Individuals with a BMI of 25.0-29.9 are considered overweight. Those with a BMI above 30.0 are considered obese . Obesity is a risk factor for many cancers, especially endometrial cancer. In fact, if you are obese, your risk for endometrial cancer may be 10 times higher. . Obesity may affect how your cancer is treated (surgery, chemotherapy, and/or radiation). . If you are overweight or obese, ask your doctor for information about diet and exercise programs.  EXERCISE Here is a list of resources for exercise recommendations and programs that can help you get started. Be sure to look for exercise programs and classes in your neighborhood to get personal support.  American Cancer Society (ACS): Eat Healthy and Get Active www.cancer.org/healthy/eathealthygetactive/ The site provides details about the importance of exercise in cancer prevention as well as resources providing exercise guidelines and tools to set goals and manage physical activity  American Council on Exercise (ACE): Get Fit www.acefitness.org/acefit  This site is full of fitness programs including personalized training workouts and a library of exercise programs. Links to local exercise trainers are provided.  American Heart Association: Getting Healthy - Physical Activity www.heart.org/HEARTORG/GettingHealthy/PhysicalActivity/Physical-Activity_UCM_001080_SubHomePage.jsp This site provides the American Heart Association guidelines for physical activity, tips for getting started and tips for long term success.  Exercising to Lose Weight Exercising can help you to lose weight. In order to lose weight through exercise, you need to do vigorous-intensity exercise. You can tell that you are exercising with vigorous intensity if you are breathing very hard and fast and cannot hold a conversation while  exercising. Moderate-intensity exercise helps to maintain your current weight. You can tell that you are exercising at a moderate level if you have a higher heart rate and faster breathing, but you are still able to hold a conversation. How often should I exercise? Choose an activity that you enjoy and set realistic goals. Your health care provider can help you to make an activity plan that works for you. Exercise regularly as directed by your health care provider. This may include:  Doing resistance training twice each week, such as: ? Push-ups. ? Sit-ups. ? Lifting weights. ? Using resistance bands.  Doing a given intensity of exercise for a given amount of time. Choose from these options: ? 150 minutes of moderate-intensity exercise every week. ? 75 minutes of vigorous-intensity exercise every week. ? A mix of moderate-intensity and vigorous-intensity exercise every week.  Children, pregnant women, people who are out of shape, people who are overweight, and older adults may need to consult a health care provider for individual recommendations. If you have any sort of medical condition, be sure to consult your health care provider before starting a new exercise program. What are some activities that can help me to lose weight?  Walking at a rate of at least 4.5 miles an hour.  Jogging or running at a rate of 5 miles per hour.  Biking at a rate of at least 10 miles per hour.  Lap swimming.  Roller-skating or in-line skating.  Cross-country skiing.  Vigorous competitive sports, such as football, basketball, and soccer.  Jumping rope.  Aerobic dancing. How can I be more active in my day-to-day activities?  Use the stairs instead of the elevator.  Take a walk during your lunch break.  If you drive, park your car farther away from work or school.    If you take public transportation, get off one stop early and walk the rest of the way.  Make all of your phone calls while  standing up and walking around.  Get up, stretch, and walk around every 30 minutes throughout the day. What guidelines should I follow while exercising?  Do not exercise so much that you hurt yourself, feel dizzy, or get very short of breath.  Consult your health care provider prior to starting a new exercise program.  Wear comfortable clothes and shoes with good support.  Drink plenty of water while you exercise to prevent dehydration or heat stroke. Body water is lost during exercise and must be replaced.  Work out until you breathe faster and your heart beats faster. This information is not intended to replace advice given to you by your health care provider. Make sure you discuss any questions you have with your health care provider. Document Released: 12/17/2010 Document Revised: 04/21/2016 Document Reviewed: 04/17/2014 Elsevier Interactive Patient Education  2018 [employer] Calorie Counting for Weight Loss Calories are units of energy. Your body needs a certain amount of calories from food to keep you going throughout the day. When you eat more calories than your body needs, your body stores the extra calories as fat. When you eat fewer calories than your body needs, your body burns fat to get the energy it needs. Calorie counting means keeping track of how many calories you eat and drink each day. Calorie counting can be helpful if you need to lose weight. If you make sure to eat fewer calories than your body needs, you should lose weight. Ask your health care provider what a healthy weight is for you. For calorie counting to work, you will need to eat the right number of calories in a day in order to lose a healthy amount of weight per week. A dietitian can help you determine how many calories you need in a day and will give you suggestions on how to reach your calorie goal.  A healthy amount of weight to lose per week is usually 1-2 lb (0.5-0.9 kg). This usually means that your  daily calorie intake should be reduced by 500-750 calories.  Eating 1,200 - 1,500 calories per day can help most women lose weight.  Eating 1,500 - 1,800 calories per day can help most men lose weight.  What is my plan? My goal is to have __________ calories per day. If I have this many calories per day, I should lose around __________ pounds per week. What do I need to know about calorie counting? In order to meet your daily calorie goal, you will need to:  Find out how many calories are in each food you would like to eat. Try to do this before you eat.  Decide how much of the food you plan to eat.  Write down what you ate and how many calories it had. Doing this is called keeping a food log.  To successfully lose weight, it is important to balance calorie counting with a healthy lifestyle that includes regular activity. Aim for 150 minutes of moderate exercise (such as walking) or 75 minutes of vigorous exercise (such as running) each week. Where do I find calorie information?  The number of calories in a food can be found on a Nutrition Facts label. If a food does not have a Nutrition Facts label, try to look up the calories online or ask your dietitian for help. Remember that calories are listed per   serving. If you choose to have more than one serving of a food, you will have to multiply the calories per serving by the amount of servings you plan to eat. For example, the label on a package of bread might say that a serving size is 1 slice and that there are 90 calories in a serving. If you eat 1 slice, you will have eaten 90 calories. If you eat 2 slices, you will have eaten 180 calories. How do I keep a food log? Immediately after each meal, record the following information in your food log:  What you ate. Don't forget to include toppings, sauces, and other extras on the food.  How much you ate. This can be measured in cups, ounces, or number of items.  How many calories each food  and drink had.  The total number of calories in the meal.  Keep your food log near you, such as in a small notebook in your pocket, or use a mobile app or website. Some programs will calculate calories for you and show you how many calories you have left for the day to meet your goal. What are some calorie counting tips?  Use your calories on foods and drinks that will fill you up and not leave you hungry: ? Some examples of foods that fill you up are nuts and nut butters, vegetables, lean proteins, and high-fiber foods like whole grains. High-fiber foods are foods with more than 5 g fiber per serving. ? Drinks such as sodas, specialty coffee drinks, alcohol, and juices have a lot of calories, yet do not fill you up.  Eat nutritious foods and avoid empty calories. Empty calories are calories you get from foods or beverages that do not have many vitamins or protein, such as candy, sweets, and soda. It is better to have a nutritious high-calorie food (such as an avocado) than a food with few nutrients (such as a bag of chips).  Know how many calories are in the foods you eat most often. This will help you calculate calorie counts faster.  Pay attention to calories in drinks. Low-calorie drinks include water and unsweetened drinks.  Pay attention to nutrition labels for "low fat" or "fat free" foods. These foods sometimes have the same amount of calories or more calories than the full fat versions. They also often have added sugar, starch, or salt, to make up for flavor that was removed with the fat.  Find a way of tracking calories that works for you. Get creative. Try different apps or programs if writing down calories does not work for you. What are some portion control tips?  Know how many calories are in a serving. This will help you know how many servings of a certain food you can have.  Use a measuring cup to measure serving sizes. You could also try weighing out portions on a kitchen  scale. With time, you will be able to estimate serving sizes for some foods.  Take some time to put servings of different foods on your favorite plates, bowls, and cups so you know what a serving looks like.  Try not to eat straight from a bag or box. Doing this can lead to overeating. Put the amount you would like to eat in a cup or on a plate to make sure you are eating the right portion.  Use smaller plates, glasses, and bowls to prevent overeating.  Try not to multitask (for example, watch TV or use your computer) while   eating. If it is time to eat, sit down at a table and enjoy your food. This will help you to know when you are full. It will also help you to be aware of what you are eating and how much you are eating. What are tips for following this plan? Reading food labels  Check the calorie count compared to the serving size. The serving size may be smaller than what you are used to eating.  Check the source of the calories. Make sure the food you are eating is high in vitamins and protein and low in saturated and trans fats. Shopping  Read nutrition labels while you shop. This will help you make healthy decisions before you decide to purchase your food.  Make a grocery list and stick to it. Cooking  Try to cook your favorite foods in a healthier way. For example, try baking instead of frying.  Use low-fat dairy products. Meal planning  Use more fruits and vegetables. Half of your plate should be fruits and vegetables.  Include lean proteins like poultry and fish. How do I count calories when eating out?  Ask for smaller portion sizes.  Consider sharing an entree and sides instead of getting your own entree.  If you get your own entree, eat only half. Ask for a box at the beginning of your meal and put the rest of your entree in it so you are not tempted to eat it.  If calories are listed on the menu, choose the lower calorie options.  Choose dishes that include  vegetables, fruits, whole grains, low-fat dairy products, and lean protein.  Choose items that are boiled, broiled, grilled, or steamed. Stay away from items that are buttered, battered, fried, or served with cream sauce. Items labeled "crispy" are usually fried, unless stated otherwise.  Choose water, low-fat milk, unsweetened iced tea, or other drinks without added sugar. If you want an alcoholic beverage, choose a lower calorie option such as a glass of wine or light beer.  Ask for dressings, sauces, and syrups on the side. These are usually high in calories, so you should limit the amount you eat.  If you want a salad, choose a garden salad and ask for grilled meats. Avoid extra toppings like bacon, cheese, or fried items. Ask for the dressing on the side, or ask for olive oil and vinegar or lemon to use as dressing.  Estimate how many servings of a food you are given. For example, a serving of cooked rice is  cup or about the size of half a baseball. Knowing serving sizes will help you be aware of how much food you are eating at restaurants. The list below tells you how big or small some common portion sizes are based on everyday objects: ? 1 oz-4 stacked dice. ? 3 oz-1 deck of cards. ? 1 tsp-1 die. ? 1 Tbsp- a ping-pong ball. ? 2 Tbsp-1 ping-pong ball. ?  cup- baseball. ? 1 cup-1 baseball. Summary  Calorie counting means keeping track of how many calories you eat and drink each day. If you eat fewer calories than your body needs, you should lose weight.  A healthy amount of weight to lose per week is usually 1-2 lb (0.5-0.9 kg). This usually means reducing your daily calorie intake by 500-750 calories.  The number of calories in a food can be found on a Nutrition Facts label. If a food does not have a Nutrition Facts label, try to look up the calories   online or ask your dietitian for help.  Use your calories on foods and drinks that will fill you up, and not on foods and drinks  that will leave you hungry.  Use smaller plates, glasses, and bowls to prevent overeating. This information is not intended to replace advice given to you by your health care provider. Make sure you discuss any questions you have with your health care provider. Document Released: 11/14/2005 Document Revised: 10/14/2016 Document Reviewed: 10/14/2016 Elsevier Interactive Patient Education  2018 [employer]  

## 2018-07-28 LAB — LIPID PANEL
CHOL/HDL RATIO: 3.6 ratio (ref 0.0–4.4)
CHOLESTEROL TOTAL: 187 mg/dL (ref 100–199)
HDL: 52 mg/dL (ref >39)
LDL Calculated: 122 mg/dL — ABNORMAL HIGH (ref 0–99)
TRIGLYCERIDES: 67 mg/dL (ref 0–149)
VLDL Cholesterol Cal: 13 mg/dL (ref 5–40)

## 2018-07-28 LAB — HEMOGLOBIN A1C
Est. average glucose Bld gHb Est-mCnc: 100 mg/dL
HEMOGLOBIN A1C: 5.1 % (ref 4.8–5.6)

## 2018-07-28 LAB — TSH: TSH: 3.78 u[IU]/mL (ref 0.450–4.500)

## 2018-08-01 ENCOUNTER — Telehealth: Payer: Self-pay

## 2018-08-01 ENCOUNTER — Encounter: Payer: Self-pay | Admitting: Family

## 2018-08-01 NOTE — Telephone Encounter (Signed)
Called to follow up with patient from oncall phone call to see if patient she got script for Doxycycline due to insurance requires prior authorization . On call notes states she was told to pay out of pocket.

## 2018-08-01 NOTE — Telephone Encounter (Signed)
Ok for [facility] to speak with patient regarding below note.

## 2018-08-06 ENCOUNTER — Telehealth: Payer: Self-pay

## 2018-08-06 NOTE — Telephone Encounter (Signed)
[employer] calling triage today in regards to pt and RX for [place] PA. Needing more info and needing to know what additional steps she will be taking for weight loss along with the Saxenda. fwding to AMS nurse as he RX'ed Saxenda for pt. CB [phone]

## 2018-08-06 NOTE — Telephone Encounter (Signed)
PA started through [employer]

## 2018-08-16 ENCOUNTER — Other Ambulatory Visit: Payer: Self-pay | Admitting: Obstetrics and Gynecology

## 2018-08-16 MED ORDER — NALTREXONE-BUPROPION HCL ER 8-90 MG PO TB12: ORAL_TABLET | ORAL | 2 refills | Status: DC

## 2018-10-01 ENCOUNTER — Encounter: Payer: Self-pay | Admitting: Obstetrics and Gynecology

## 2018-10-02 NOTE — Telephone Encounter (Signed)
Spoke with pt. Pt has long hx of depression sx. Has tried different meds but also never filled multiple Rx. Friend is on trintellix and doing well. Pt interested in trying Rx. But, she is also planning to start contrave, given to her by Dr. Bonney Aid. Discussed wellbutrin in contrave and anti-depressive effect of this Rx. Plus, pt usually has to fail several meds before trintellix authorized by ins co. Pt to try contrave and follow depression sx. If not improving, can try trintellix. Pt understands.

## 2018-10-12 DIAGNOSIS — J019 Acute sinusitis, unspecified: Secondary | ICD-10-CM | POA: Diagnosis not present

## 2018-10-12 DIAGNOSIS — R6889 Other general symptoms and signs: Secondary | ICD-10-CM | POA: Diagnosis not present

## 2018-10-12 DIAGNOSIS — B9689 Other specified bacterial agents as the cause of diseases classified elsewhere: Secondary | ICD-10-CM | POA: Diagnosis not present

## 2018-11-01 DIAGNOSIS — J029 Acute pharyngitis, unspecified: Secondary | ICD-10-CM | POA: Diagnosis not present

## 2018-11-05 ENCOUNTER — Encounter: Payer: Self-pay | Admitting: Obstetrics and Gynecology

## 2018-11-05 NOTE — Telephone Encounter (Signed)
LMTRC

## 2018-11-06 ENCOUNTER — Encounter: Payer: Self-pay | Admitting: Obstetrics and Gynecology

## 2018-11-06 NOTE — Telephone Encounter (Signed)
Spoke with pt. Was sex active over wknd. Had cut at vaginal opening after sex that is painful/was bleeding. Bleeding has stopped but still hurts. Has pain with urination. Feels like cut is 1-2 inches long and is open at first layer.  Discussed too late to suture and no longer bleeding. Vag tissue heals quickly. Can RTO with MD for further eval, possible need for packing vs sitz baths/triple abx crm. Pt elects to soak and watch for now.  F/u prn.

## 2018-11-06 NOTE — Telephone Encounter (Signed)
Documented on separate msg.

## 2018-11-06 NOTE — Telephone Encounter (Signed)
Patient is calling to speak with [employer]. Patient would like a call at her work number please. [phone]

## 2018-11-26 DIAGNOSIS — B9789 Other viral agents as the cause of diseases classified elsewhere: Secondary | ICD-10-CM | POA: Diagnosis not present

## 2018-11-26 DIAGNOSIS — J069 Acute upper respiratory infection, unspecified: Secondary | ICD-10-CM | POA: Diagnosis not present

## 2018-11-26 DIAGNOSIS — R05 Cough: Secondary | ICD-10-CM | POA: Diagnosis not present

## 2019-06-20 ENCOUNTER — Ambulatory Visit (INDEPENDENT_AMBULATORY_CARE_PROVIDER_SITE_OTHER): Payer: BC Managed Care – PPO | Admitting: Obstetrics and Gynecology

## 2019-06-20 ENCOUNTER — Other Ambulatory Visit (HOSPITAL_COMMUNITY)
Admission: RE | Admit: 2019-06-20 | Discharge: 2019-06-20 | Disposition: A | Discharge status: Home / Self Care | Payer: BC Managed Care – PPO | Source: Ambulatory Visit | Attending: Obstetrics and Gynecology | Admitting: Obstetrics and Gynecology

## 2019-06-20 ENCOUNTER — Encounter: Payer: Self-pay | Admitting: Obstetrics and Gynecology

## 2019-06-20 ENCOUNTER — Other Ambulatory Visit: Payer: Self-pay

## 2019-06-20 VITALS — BP 120/90 | Ht 65.0 in | Wt 248.6 lb

## 2019-06-20 DIAGNOSIS — Z113 Encounter for screening for infections with a predominantly sexual mode of transmission: Secondary | ICD-10-CM | POA: Insufficient documentation

## 2019-06-20 DIAGNOSIS — Z01419 Encounter for gynecological examination (general) (routine) without abnormal findings: Secondary | ICD-10-CM | POA: Diagnosis not present

## 2019-06-20 DIAGNOSIS — Z1239 Encounter for other screening for malignant neoplasm of breast: Secondary | ICD-10-CM

## 2019-06-20 DIAGNOSIS — Z0000 Encounter for general adult medical examination without abnormal findings: Secondary | ICD-10-CM

## 2019-06-20 DIAGNOSIS — F329 Major depressive disorder, single episode, unspecified: Secondary | ICD-10-CM

## 2019-06-20 DIAGNOSIS — Z13220 Encounter for screening for lipoid disorders: Secondary | ICD-10-CM

## 2019-06-20 DIAGNOSIS — Z800 Family history of malignant neoplasm of digestive organs: Secondary | ICD-10-CM

## 2019-06-20 DIAGNOSIS — Z131 Encounter for screening for diabetes mellitus: Secondary | ICD-10-CM

## 2019-06-20 DIAGNOSIS — F32A Depression, unspecified: Secondary | ICD-10-CM

## 2019-06-20 DIAGNOSIS — Z6841 Body Mass Index (BMI) 40.0 and over, adult: Secondary | ICD-10-CM

## 2019-06-20 MED ORDER — VORTIOXETINE HBR 10 MG PO TABS: 10.0000 mg | ORAL_TABLET | Freq: Every day | ORAL | 1 refills | Status: DC

## 2019-06-20 NOTE — Progress Notes (Signed)
PCP:  Allegra GranaArnett, Margaret G, FNP   Chief Complaint  Patient presents with  . Gynecologic Exam     HPI:      [name] is a 48 y.o. G1P1001 who LMP was No LMP recorded. Patient has had a hysterectomy., presents today for her annual examination.  Her [facility].  Dysmenorrhea none. She does not have postmenopausal bleeding.  Sex activity: not sexually active. Has been in past yr and would like full STD testing.  Last Pap: February 17, 2017  Results were: no abnormalities ; s/p LGSIL 2015 with HPV on bx. Hx of STDs: HPV Hx of vaginal lesion 2018 with neg HSV 2 IgG several times. Had pos HSV 1/2 combo IgM. Pt still concerned about HSV2 and wants repeat testing.   Last mammogram: not recent There is no FH of breast cancer. There is no FH of ovarian cancer. The patient does do self-breast exams.  Tobacco use: few cigs daily Alcohol use: few times weekly No drug use.  Exercise: not active  She does get adequate calcium and Vitamin D in her diet. No recent fasting labs.   Pt with long hx of anxiety/depression since she was a teenager. Has been on several SSRIs/wellbutrin in past without sx improvement. Had severe fatigue with zoloft. Did contrave last yr so didn't start antidepressant since contained wellbutrin, but pt stopped after a few months. Still having sx, would like to try trintellix. Had samples from friend that worked well. Would also like to see a therapist again. Pt feels down, has sleep issues, little energy, worry and irritability. Has had SI "since age 48 but wouldn't go through with it".   Past Medical History:  Diagnosis Date  . Asthma   . Condyloma acuminatum   . Depression   . GERD (gastroesophageal reflux disease)   . LGSIL on Pap smear of cervix   . Obesity     Past Surgical History:  Procedure Laterality Date  . HYSTEROSCOPY W/D&C  04/26/2011   simple hyperplasia, endometrial polyp  . LAPAROSCOPIC GASTRIC  SLEEVE RESECTION  2012  . PUBOVAGINAL SLING  04/03/2012   using TVTdevice  . TONSILLECTOMY AND ADENOIDECTOMY  2000  . TOTAL VAGINAL HYSTERECTOMY  04/03/2012   uterine prolapse    Family History  Problem Relation Age of Onset  . Diabetes Father   . Colon cancer Father [id number]  . Alcohol abuse Daughter   . Drug abuse Daughter     Social History   Socioeconomic History  . Marital status: Single    Spouse name: Not on file  . Number of children: 1  . Years of education: 3514  . Highest education level: Not on file  Occupational History  . Occupation: Clinical biochemistcustomer service  Social Needs  . Financial resource strain: Not on file  . Food insecurity    Worry: Not on file    Inability: Not on file  . Transportation needs    Medical: Not on file    Non-medical: Not on file  Tobacco Use  . Smoking status: Current Every Day Smoker  . Smokeless tobacco: Never Used  Substance and Sexual Activity  . Alcohol use: Yes  . Drug use: No  . Sexual activity: Yes    Birth control/protection: Surgical    Comment: Hysterectomy   Lifestyle  . Physical activity    Days per week: Not on file    Minutes per session: Not on file  .  Stress: Not on file  Relationships  . Social Musicianconnections    Talks on phone: Not on file    Gets together: Not on file    Attends religious service: Not on file    Active member of club or organization: Not on file    Attends meetings of clubs or organizations: Not on file    Relationship status: Not on file  . Intimate partner violence    Fear of current or ex partner: Not on file    Emotionally abused: Not on file    Physically abused: Not on file    Forced sexual activity: Not on file  Other Topics Concern  . Not on file  Social History Narrative   Lives in [place]      Works- Health visitorinsurance and retail      Living with parents      One daughter who has grandson       Outpatient Medications Prior to Visit  Medication Sig Dispense Refill  . albuterol  (PROVENTIL HFA;VENTOLIN HFA) 108 (90 Base) MCG/ACT inhaler Inhale 1-2 puffs into the lungs every 6 (six) hours as needed for wheezing or shortness of breath. 1 Inhaler 0  . valACYclovir (VALTREX) 500 MG tablet Take 500 mg by mouth 2 (two) times daily.    . Naltrexone-buPROPion HCl ER (CONTRAVE) 8-90 MG TB12 1 tab po once daily for 7 days, 1 tab po bid for 7 days,  2 tab po every AM and 1 tab po every PM for 7 days, then 2 tablets po bid (Patient not taking: Reported on 06/20/2019) 120 tablet 2  . clotrimazole (LOTRIMIN) 1 % cream Apply 1 application topically 2 (two) times daily. 30 g 1  . doxycycline (DORYX) 100 MG EC tablet Take 1 tablet (100 mg total) by mouth 2 (two) times daily. 20 tablet 0  . Insulin Pen Needle (NOVOFINE) 32G X 6 MM MISC 1 Units by Does not apply route daily. 100 each 3   No facility-administered medications prior to visit.       ROS:  Review of Systems  Constitutional: Negative for fatigue, fever and unexpected weight change.  Respiratory: Negative for cough, shortness of breath and wheezing.   Cardiovascular: Negative for chest pain, palpitations and leg swelling.  Gastrointestinal: Negative for blood in stool, constipation, diarrhea, nausea and vomiting.  Endocrine: Negative for cold intolerance, heat intolerance and polyuria.  Genitourinary: Negative for dyspareunia, dysuria, flank pain, frequency, genital sores, hematuria, menstrual problem, pelvic pain, urgency, vaginal bleeding, vaginal discharge and vaginal pain.  Musculoskeletal: Negative for back pain, joint swelling and myalgias.  Skin: Negative for rash.  Neurological: Negative for dizziness, syncope, light-headedness, numbness and headaches.  Hematological: Negative for adenopathy.  Psychiatric/Behavioral: Positive for agitation and dysphoric mood. Negative for confusion, sleep disturbance and suicidal ideas. The patient is not nervous/anxious.   BREAST: No symptoms   Objective: BP 120/90   Ht 5\' 5"   (1.651 m)   Wt 248 lb 9.6 oz (112.8 kg)   BMI 41.37 kg/m    Physical Exam Constitutional:      Appearance: She is well-developed.  Genitourinary:     Vulva, vagina, right adnexa and left adnexa normal.     No vaginal discharge, erythema or tenderness.     Cervix is absent.     Uterus is absent.     No right or left adnexal mass present.     Right adnexa not tender.     Left adnexa not tender.  Genitourinary Comments: UTERUS/CX SURG REM  Neck:     Musculoskeletal: Normal range of motion.     Thyroid: No thyromegaly.  Cardiovascular:     Rate and Rhythm: Normal rate and regular rhythm.     Heart sounds: Normal heart sounds. No murmur.  Pulmonary:     Effort: Pulmonary effort is normal.     Breath sounds: Normal breath sounds.  Chest:     Breasts:        Right: No mass, nipple discharge, skin change or tenderness.        Left: No mass, nipple discharge, skin change or tenderness.  Abdominal:     Palpations: Abdomen is soft.     Tenderness: There is no abdominal tenderness. There is no guarding.  Musculoskeletal: Normal range of motion.  Neurological:     General: No focal deficit present.     Mental Status: She is alert and oriented to person, place, and time.     Cranial Nerves: No cranial nerve deficit.  Skin:    General: Skin is warm and dry.  Psychiatric:        Mood and Affect: Mood normal.        Behavior: Behavior normal.        Thought Content: Thought content normal.        Judgment: Judgment normal.  Vitals signs reviewed.     Results:  Depression screen PHQ 2/9 06/20/2019  Decreased Interest 1  Down, Depressed, Hopeless 3  PHQ - 2 Score 4  Altered sleeping 3  Tired, decreased energy 2  Change in appetite 2  Feeling bad or failure about yourself  3  Trouble concentrating 3  Moving slowly or fidgety/restless 0  Suicidal thoughts 1  PHQ-9 Score 18  Difficult doing work/chores Somewhat difficult   GAD 7 : Generalized Anxiety Score 06/20/2019  01/23/2017  Nervous, Anxious, on Edge 2 1  Control/stop worrying 3 3  Worry too much - different things 3 3  Trouble relaxing 2 2  Restless 0 0  Easily annoyed or irritable 1 1  Afraid - awful might happen 2 1  Total GAD 7 Score 13 11  Anxiety Difficulty Somewhat difficult Somewhat difficult     Assessment/Plan: Encounter for annual routine gynecological examination -   Screening for breast cancer - Plan: MM 3D SCREEN BREAST BILATERAL, Pt to sched mammo  Screening for STD (sexually transmitted disease) - Plan: HIV Antibody (routine testing w rflx), RPR, HSV 2 antibody, IgG, Hepatitis C antibody, Cervicovaginal ancillary only, Will call with results.   Anxiety and depression - Plan: vortioxetine HBr (TRINTELLIX) 10 MG TABS tablet, Start trintellix. Rx eRxd/savings card program online. RTO in 7 wks. Names of therapists given. F/u prn.   Family history of colon cancer - Plan: Due for scr colonoscopy due to FH/age. Refer to GI.  Blood tests for routine general physical examination - Plan: Comprehensive metabolic panel, Lipid panel, Hemoglobin A1c,   Screening cholesterol level - Plan: Lipid panel,   Screening for diabetes mellitus - Plan: Hemoglobin A1c,   BMI 40.0-44.9, adult (HCC) - Plan: Lipid panel, Hemoglobin A1c,   Meds ordered this encounter  Medications  . vortioxetine HBr (TRINTELLIX) 10 MG TABS tablet    Sig: Take 1 tablet (10 mg total) by mouth daily.    Dispense:  30 tablet    Refill:  1    Order Specific Question:   Supervising Provider    Answer:   Nadara MustardHARRIS, ROBERT P [[id number]]  GYN counsel breast self exam, mammography screening, adequate intake of calcium and vitamin D, diet and exercise     F/U  Return in about 7 weeks (around 08/08/2019) for anxiety/depression f/u.  Judy Simmons B. Gustavo Meditz, PA-C 06/20/2019 3:29 PM

## 2019-06-20 NOTE — Patient Instructions (Addendum)
I value your feedback and entrusting us with your care. If you get a [facility] patient survey, I would appreciate you taking the time to let us know about your experience today. Thank you!  [facility]: [phone]  [facility]: [phone]  

## 2019-06-21 ENCOUNTER — Encounter: Payer: Self-pay | Admitting: Obstetrics and Gynecology

## 2019-06-21 ENCOUNTER — Telehealth: Payer: Self-pay

## 2019-06-21 LAB — HSV 2 ANTIBODY, IGG: HSV 2 IgG, Type Spec: <0.91 index (ref 0.00–0.90)

## 2019-06-21 LAB — HEPATITIS C ANTIBODY: Hep C Virus Ab: 0.1 s/co ratio (ref 0.0–0.9)

## 2019-06-21 LAB — RPR: RPR Ser Ql: NONREACTIVE

## 2019-06-21 LAB — HIV ANTIBODY (ROUTINE TESTING W REFLEX): HIV Screen 4th Generation wRfx: NONREACTIVE

## 2019-06-21 NOTE — Telephone Encounter (Signed)
Pt calling re Trintelex rx; pharm needs prior auth before they can file ins and before they file card.  Can it be done today?  [phone]

## 2019-06-21 NOTE — Telephone Encounter (Signed)
Received PA for Trintellix, per [employer] , called pt to advise to use coupon card first and hopefully it would override the PA. Pt says she hasn't tried looking for the coupon online, but will do and will let us know if it worked.

## 2019-06-22 LAB — CERVICOVAGINAL ANCILLARY ONLY
Chlamydia: NEGATIVE
Neisseria Gonorrhea: NEGATIVE
Trichomonas: NEGATIVE

## 2019-06-22 NOTE — Progress Notes (Signed)
Pls let pt know all STD testing neg. THx

## 2019-06-24 NOTE — Telephone Encounter (Signed)
Hi Jenn. I submitted the PA and it was approved. Drenda Freeze, CMA

## 2019-06-24 NOTE — Progress Notes (Signed)
Called pt, no answer, could not leave voice msg. 

## 2019-07-09 ENCOUNTER — Encounter: Payer: Self-pay | Admitting: Family

## 2019-07-09 DIAGNOSIS — J069 Acute upper respiratory infection, unspecified: Secondary | ICD-10-CM

## 2019-07-09 DIAGNOSIS — J4541 Moderate persistent asthma with (acute) exacerbation: Secondary | ICD-10-CM

## 2019-07-09 MED ORDER — ALBUTEROL SULFATE HFA 108 (90 BASE) MCG/ACT IN AERS: 1.0000 | INHALATION_SPRAY | Freq: Four times a day (QID) | RESPIRATORY_TRACT | 2 refills | Status: DC | PRN

## 2019-07-10 ENCOUNTER — Encounter: Payer: Self-pay | Admitting: *Deleted

## 2019-07-11 ENCOUNTER — Telehealth: Payer: Self-pay

## 2019-07-11 NOTE — Telephone Encounter (Signed)
Contacted patient in regards to scheduling her for her screening colonoscopy.  She does have a family history of colon cancer with her father.  In 2012 she had a gastric sleeve at [facility], they were supposed to taken care of hernia repair.  She's unsure if it was done.  She has been experiencing worsening heartburn-occurring daily.  She takes TUMS-symptoms are not relieved. Office visit has been scheduled to see you to discuss worsening heartburn and schedule screening colonoscopy on 08/15/19 at 2:15.  Her triage is as follows: Gastroenterology Pre-Procedure Review  Request Date:  Requesting Physician: Dr. Bonna Gains  PATIENT REVIEW QUESTIONS: The patient responded to the following health history questions as indicated:    1. Are you having any GI issues? yes (heartburn) 2. Do you have a personal history of Polyps? no 3. Do you have a family history of Colon Cancer or Polyps? yes (father colon cancer) 4. Diabetes Mellitus? no 5. Joint replacements in the past 12 months?no 6. Major health problems in the past 3 months?no 7. Any artificial heart valves, MVP, or defibrillator?no    MEDICATIONS & ALLERGIES:    Patient reports the following regarding taking any anticoagulation/antiplatelet therapy:   Plavix, Coumadin, Eliquis, Xarelto, Lovenox, Pradaxa, Brilinta, or Effient? no Aspirin? no  Patient confirms/reports the following medications:  Current Outpatient Medications  Medication Sig Dispense Refill  . albuterol (VENTOLIN HFA) 108 (90 Base) MCG/ACT inhaler Inhale 1-2 puffs into the lungs every 6 (six) hours as needed for wheezing or shortness of breath. 18 g 2  . Naltrexone-buPROPion HCl ER (CONTRAVE) 8-90 MG TB12 1 tab po once daily for 7 days, 1 tab po bid for 7 days,  2 tab po every AM and 1 tab po every PM for 7 days, then 2 tablets po bid (Patient not taking: Reported on 06/20/2019) 120 tablet 2  . valACYclovir (VALTREX) 500 MG tablet Take 500 mg by mouth 2 (two) times daily.     Marland Kitchen vortioxetine HBr (TRINTELLIX) 10 MG TABS tablet Take 1 tablet (10 mg total) by mouth daily. 30 tablet 1   No current facility-administered medications for this visit.     Patient confirms/reports the following allergies:  Allergies  Allergen Reactions  . Nsaids Other (See Comments)    Gastric Sleeve surgery  . Penicillin G Other (See Comments)    Rxn as a Child    No orders of the defined types were placed in this encounter.   AUTHORIZATION INFORMATION Primary Insurance: 1D#: Group #:  Secondary Insurance: 1D#: Group #:  SCHEDULE INFORMATION: Date:  Time: Location:

## 2019-07-31 DIAGNOSIS — H5213 Myopia, bilateral: Secondary | ICD-10-CM | POA: Diagnosis not present

## 2019-08-15 ENCOUNTER — Other Ambulatory Visit: Payer: Self-pay

## 2019-08-15 ENCOUNTER — Ambulatory Visit: Payer: BC Managed Care – PPO | Admitting: Gastroenterology

## 2019-08-15 ENCOUNTER — Encounter: Payer: Self-pay | Admitting: Gastroenterology

## 2019-08-15 VITALS — BP 124/83 | HR 81 | Temp 98.2°F | Wt 250.0 lb

## 2019-08-15 DIAGNOSIS — Z800 Family history of malignant neoplasm of digestive organs: Secondary | ICD-10-CM

## 2019-08-15 DIAGNOSIS — K219 Gastro-esophageal reflux disease without esophagitis: Secondary | ICD-10-CM | POA: Diagnosis not present

## 2019-08-15 MED ORDER — BISACODYL EC 5 MG PO TBEC: DELAYED_RELEASE_TABLET | ORAL | 0 refills | Status: DC

## 2019-08-15 MED ORDER — NA SULFATE-K SULFATE-MG SULF 17.5-3.13-1.6 GM/177ML PO SOLN: 354.0000 mL | Freq: Once | ORAL | 0 refills | Status: AC

## 2019-08-15 NOTE — Progress Notes (Signed)
Judy Simmons [address]  [place], [place] [address]  Main: [phone]  Fax: [phone]   Gastroenterology Consultation  Referring Provider:     [name], PA-C Primary Care Physician:  [name], FNP Reason for Consultation:     GERD        HPI:    Chief Complaint  Patient presents with  . Gastroesophageal Reflux    it is daily and burning in throat     Judy Simmons is a 48 y.o. y/o female referred for consultation & management  by Dr. Vidal Schwalbe, Yvetta Coder, FNP.  Patient with history of gastric sleeve 4 to 5 years ago for weight loss with no complications post surgery, but reports increased reflux symptoms since then.  Reports daily severe heartburn.  Has to sit up for hours after eating as reflux symptoms are worse with laying down.  No dysphagia, no nausea or vomiting, with no recent weight loss.  Reports taking over-the-counter omeprazole once daily for over a year and it did not help.  Has not taking this in a long time but he is using Tums multiple times a day.  Family history of colon cancer in her father in his 66s.  Has never had a colonoscopy.  Past Medical History:  Diagnosis Date  . Asthma   . Condyloma acuminatum   . Depression   . GERD (gastroesophageal reflux disease)   . LGSIL on Pap smear of cervix   . Obesity     Past Surgical History:  Procedure Laterality Date  . HYSTEROSCOPY W/D&C  04/26/2011   simple hyperplasia, endometrial polyp  . [place] RESECTION  2012  . PUBOVAGINAL SLING  04/03/2012   using TVTdevice  . TONSILLECTOMY AND ADENOIDECTOMY  2000  . TOTAL VAGINAL HYSTERECTOMY  04/03/2012   uterine prolapse    Prior to Admission medications   Medication Sig Start Date End Date Taking? Authorizing Provider  albuterol (VENTOLIN HFA) 108 (90 Base) MCG/ACT inhaler Inhale 1-2 puffs into the lungs every 6 (six) hours as needed for wheezing or shortness of breath. 07/09/19  Yes  Arnett, Yvetta Coder, FNP  valACYclovir (VALTREX) 500 MG tablet Take 500 mg by mouth 2 (two) times daily.   Yes [provider]  vortioxetine HBr (TRINTELLIX) 10 MG TABS tablet Take 1 tablet (10 mg total) by mouth daily. 4/94/49  Yes Copland, Deirdre Evener, PA-C    Family History  Problem Relation Age of Onset  . Diabetes Father   . Colon cancer Father 62  . Alcohol abuse Daughter   . Drug abuse Daughter      Social History   Tobacco Use  . Smoking status: Current Every Day Smoker  . Smokeless tobacco: Never Used  Substance Use Topics  . Alcohol use: Yes  . Drug use: No    Allergies as of 08/15/2019 - Review Complete 08/15/2019  Allergen Reaction Noted  . Nsaids Other (See Comments) 06/16/2017  . Penicillin g Other (See Comments) 08/30/2014    Review of Systems:    All systems reviewed and negative except where noted in HPI.   Physical Exam:  BP 124/83 (BP Location: Left Arm, Patient Position: Sitting, Cuff Size: Large)   Pulse 81   Temp 98.2 F (36.8 C) (Oral)   Wt 250 lb (113.4 kg)   BMI 41.60 kg/m  No LMP recorded. Patient has had a hysterectomy. Psych:  Alert and cooperative. Normal mood and affect. General:  Alert,  Well-developed, well-nourished, pleasant and cooperative in NAD Head:  Normocephalic and atraumatic. Eyes:  Sclera clear, no icterus.   Conjunctiva pink. Ears:  Normal auditory acuity. Nose:  No deformity, discharge, or lesions. Mouth:  No deformity or lesions,oropharynx pink & moist. Neck:  Supple; no masses or thyromegaly. Abdomen:  Normal bowel sounds.  No bruits.  Soft, non-tender and non-distended without masses, hepatosplenomegaly or hernias noted.  No guarding or rebound tenderness.    Msk:  Symmetrical without gross deformities. Good, equal movement & strength bilaterally. Pulses:  Normal pulses noted. Extremities:  No clubbing or edema.  No cyanosis. Neurologic:  Alert and oriented x3;  grossly normal neurologically. Skin:  Intact  without significant lesions or rashes. No jaundice. Lymph Nodes:  No significant cervical adenopathy. Psych:  Alert and cooperative. Normal mood and affect.   Labs: November 2019 labs in care everywhere with normal hemoglobin and normal liver enzymes in 2018 Imaging Studies: No results found.  Assessment and Plan:   Judy Simmons is a 48 y.o. y/o female has been referred for GERD  Due to severe symptoms ongoing chronically, and no prior relief of symptoms with PPI, EGD indicated to rule out hiatal hernia, Barrett's, esophagitis  Patient educated extensively on acid reflux lifestyle modification, including buying a bed wedge, not eating 3 hrs before bedtime, diet modifications, and handout given for the same.   High risk screening colonoscopy also indicated  I have discussed alternative options, risks & benefits,  which include, but are not limited to, bleeding, infection, perforation,respiratory complication & drug reaction.  The patient agrees with this plan & written consent will be obtained.    Can start PPI after EGD   Dr Melodie BouillonVarnita Addelyn Alleman  Speech recognition software was used to dictate the above note.

## 2019-08-15 NOTE — Patient Instructions (Signed)

## 2019-09-06 ENCOUNTER — Other Ambulatory Visit
Admission: RE | Admit: 2019-09-06 | Discharge: 2019-09-06 | Disposition: A | Discharge status: Home / Self Care | Payer: BC Managed Care – PPO | Source: Ambulatory Visit | Attending: Gastroenterology | Admitting: Gastroenterology

## 2019-09-06 DIAGNOSIS — Z01812 Encounter for preprocedural laboratory examination: Secondary | ICD-10-CM | POA: Insufficient documentation

## 2019-09-06 DIAGNOSIS — Z20828 Contact with and (suspected) exposure to other viral communicable diseases: Secondary | ICD-10-CM | POA: Insufficient documentation

## 2019-09-06 LAB — SARS CORONAVIRUS 2 (TAT 6-24 HRS): SARS Coronavirus 2: NEGATIVE

## 2019-09-08 ENCOUNTER — Other Ambulatory Visit: Payer: Self-pay | Admitting: Obstetrics and Gynecology

## 2019-09-08 DIAGNOSIS — F329 Major depressive disorder, single episode, unspecified: Secondary | ICD-10-CM

## 2019-09-08 DIAGNOSIS — F419 Anxiety disorder, unspecified: Secondary | ICD-10-CM

## 2019-09-08 DIAGNOSIS — F32A Depression, unspecified: Secondary | ICD-10-CM

## 2019-09-09 ENCOUNTER — Encounter: Payer: Self-pay | Admitting: Obstetrics and Gynecology

## 2019-09-09 NOTE — Telephone Encounter (Signed)
How did you get notified of PA?

## 2019-09-11 ENCOUNTER — Encounter
Admission: RE | Disposition: A | Discharge status: Home / Self Care | Payer: Self-pay | Source: Home / Self Care | Attending: Gastroenterology

## 2019-09-11 ENCOUNTER — Ambulatory Visit
Admission: RE | Admit: 2019-09-11 | Discharge: 2019-09-11 | Disposition: A | Discharge status: Home / Self Care | Payer: BC Managed Care – PPO | Attending: Gastroenterology | Admitting: Gastroenterology

## 2019-09-11 ENCOUNTER — Ambulatory Visit: Payer: BC Managed Care – PPO | Admitting: Anesthesiology

## 2019-09-11 ENCOUNTER — Encounter: Payer: Self-pay | Admitting: *Deleted

## 2019-09-11 DIAGNOSIS — K2100 Gastro-esophageal reflux disease with esophagitis, without bleeding: Secondary | ICD-10-CM

## 2019-09-11 DIAGNOSIS — Z800 Family history of malignant neoplasm of digestive organs: Secondary | ICD-10-CM | POA: Diagnosis not present

## 2019-09-11 DIAGNOSIS — R12 Heartburn: Secondary | ICD-10-CM | POA: Insufficient documentation

## 2019-09-11 DIAGNOSIS — K3189 Other diseases of stomach and duodenum: Secondary | ICD-10-CM | POA: Diagnosis not present

## 2019-09-11 DIAGNOSIS — Z880 Allergy status to penicillin: Secondary | ICD-10-CM | POA: Diagnosis not present

## 2019-09-11 DIAGNOSIS — K2950 Unspecified chronic gastritis without bleeding: Secondary | ICD-10-CM | POA: Diagnosis not present

## 2019-09-11 DIAGNOSIS — E669 Obesity, unspecified: Secondary | ICD-10-CM | POA: Diagnosis not present

## 2019-09-11 DIAGNOSIS — K635 Polyp of colon: Secondary | ICD-10-CM | POA: Diagnosis not present

## 2019-09-11 DIAGNOSIS — K449 Diaphragmatic hernia without obstruction or gangrene: Secondary | ICD-10-CM

## 2019-09-11 DIAGNOSIS — D125 Benign neoplasm of sigmoid colon: Secondary | ICD-10-CM | POA: Diagnosis not present

## 2019-09-11 DIAGNOSIS — K219 Gastro-esophageal reflux disease without esophagitis: Secondary | ICD-10-CM | POA: Diagnosis not present

## 2019-09-11 DIAGNOSIS — F329 Major depressive disorder, single episode, unspecified: Secondary | ICD-10-CM | POA: Insufficient documentation

## 2019-09-11 DIAGNOSIS — J45909 Unspecified asthma, uncomplicated: Secondary | ICD-10-CM | POA: Insufficient documentation

## 2019-09-11 DIAGNOSIS — Z1211 Encounter for screening for malignant neoplasm of colon: Secondary | ICD-10-CM | POA: Insufficient documentation

## 2019-09-11 DIAGNOSIS — F17200 Nicotine dependence, unspecified, uncomplicated: Secondary | ICD-10-CM | POA: Insufficient documentation

## 2019-09-11 DIAGNOSIS — K228 Other specified diseases of esophagus: Secondary | ICD-10-CM | POA: Diagnosis not present

## 2019-09-11 DIAGNOSIS — K2960 Other gastritis without bleeding: Secondary | ICD-10-CM | POA: Diagnosis not present

## 2019-09-11 DIAGNOSIS — Z6841 Body Mass Index (BMI) 40.0 and over, adult: Secondary | ICD-10-CM | POA: Diagnosis not present

## 2019-09-11 DIAGNOSIS — Z79899 Other long term (current) drug therapy: Secondary | ICD-10-CM | POA: Insufficient documentation

## 2019-09-11 DIAGNOSIS — Z886 Allergy status to analgesic agent status: Secondary | ICD-10-CM | POA: Insufficient documentation

## 2019-09-11 HISTORY — PX: ESOPHAGOGASTRODUODENOSCOPY (EGD) WITH PROPOFOL: SHX5813

## 2019-09-11 HISTORY — PX: COLONOSCOPY WITH PROPOFOL: SHX5780

## 2019-09-11 SURGERY — COLONOSCOPY WITH PROPOFOL
Anesthesia: General

## 2019-09-11 MED ORDER — OMEPRAZOLE 20 MG PO CPDR: 20.0000 mg | DELAYED_RELEASE_CAPSULE | Freq: Every day | ORAL | 0 refills | Status: DC

## 2019-09-11 MED ORDER — LIDOCAINE HCL (CARDIAC) PF 100 MG/5ML IV SOSY
PREFILLED_SYRINGE | INTRAVENOUS | Status: DC | PRN
  Administered 2019-09-11: 100 mg via INTRAVENOUS

## 2019-09-11 MED ORDER — PROPOFOL 10 MG/ML IV BOLUS
INTRAVENOUS | Status: DC | PRN
  Administered 2019-09-11 (×7): 30 mg via INTRAVENOUS
  Administered 2019-09-11 (×3): 50 mg via INTRAVENOUS
  Administered 2019-09-11 (×2): 30 mg via INTRAVENOUS
  Administered 2019-09-11: 100 mg via INTRAVENOUS

## 2019-09-11 MED ORDER — LIDOCAINE HCL (PF) 2 % IJ SOLN
INTRAMUSCULAR | Status: AC
  Filled 2019-09-11: qty 10

## 2019-09-11 MED ORDER — PHENYLEPHRINE HCL (PRESSORS) 10 MG/ML IV SOLN
INTRAVENOUS | Status: DC | PRN
  Administered 2019-09-11 (×2): 100 ug via INTRAVENOUS

## 2019-09-11 MED ORDER — PROPOFOL 10 MG/ML IV BOLUS
INTRAVENOUS | Status: AC
  Filled 2019-09-11: qty 20

## 2019-09-11 MED ORDER — SODIUM CHLORIDE 0.9 % IV SOLN
INTRAVENOUS | Status: DC
  Administered 2019-09-11: 10:00:00 1000 mL via INTRAVENOUS

## 2019-09-11 MED ORDER — PROPOFOL 500 MG/50ML IV EMUL
INTRAVENOUS | Status: AC
  Filled 2019-09-11: qty 50

## 2019-09-11 NOTE — Anesthesia Postprocedure Evaluation (Signed)
Anesthesia Post Note  Patient: Judy Simmons  Procedure(s) Performed: COLONOSCOPY WITH PROPOFOL (N/A ) ESOPHAGOGASTRODUODENOSCOPY (EGD) WITH PROPOFOL (N/A )  Patient location during evaluation: Endoscopy Anesthesia Type: General Level of consciousness: awake and alert Pain management: pain level controlled Vital Signs Assessment: post-procedure vital signs reviewed and stable Respiratory status: spontaneous breathing and respiratory function stable Cardiovascular status: stable Anesthetic complications: no     Last Vitals:  Vitals:   09/11/19 1039 09/11/19 1058  BP: (!) 103/54   Pulse: 71 64  Resp: 14 11  Temp: (!) 36.1 C   SpO2: 95% 98%    Last Pain:  Vitals:   09/11/19 1039  TempSrc: Tympanic  PainSc: 0-No pain                 [name] K

## 2019-09-11 NOTE — Op Note (Signed)
[facility] Gastroenterology Patient Name: Judy Simmons Procedure Date: 09/11/2019 9:42 AM MRN: [id number] Account #: [id number] Date of Birth: 1971/06/27 Admit Type: Outpatient Age: 48 Room: [facility] ENDO ROOM 2 Gender: Female Note Status: Finalized Procedure:            Colonoscopy Indications:          Screening in patient at increased risk: Family history                        of 1st-degree relative with colorectal cancer before                        age 39 years Providers:            Phelicia Dantes B. Bonna Gains MD, MD Medicines:            Monitored Anesthesia Care Complications:        No immediate complications. Procedure:            Pre-Anesthesia Assessment:                       - ASA Grade Assessment: II - A patient with mild                        systemic disease.                       - Prior to the procedure, a History and Physical was                        performed, and patient medications, allergies and                        sensitivities were reviewed. The patient's tolerance of                        previous anesthesia was reviewed.                       - The risks and benefits of the procedure and the                        sedation options and risks were discussed with the                        patient. All questions were answered and informed                        consent was obtained.                       - Patient identification and proposed procedure were                        verified prior to the procedure by the physician, the                        nurse, the anesthesiologist, the anesthetist and the                        technician. The procedure was verified in the procedure  room.                       After obtaining informed consent, the colonoscope was                        passed under direct vision. Throughout the procedure,                        the patient's blood pressure, pulse, and oxygen                         saturations were monitored continuously. The                        Colonoscope was introduced through the anus and                        advanced to the the cecum, identified by appendiceal                        orifice and ileocecal valve. The colonoscopy was                        performed with ease. The patient tolerated the                        procedure well. The quality of the bowel preparation                        was good. Findings:      The perianal and digital rectal examinations were normal.      A 5 mm polyp was found in the sigmoid colon. The polyp was sessile. The       polyp was removed with a jumbo cold forceps. Resection and retrieval       were complete.      The exam was otherwise without abnormality.      The rectum, sigmoid colon, descending colon, transverse colon, ascending       colon and cecum appeared normal.      The retroflexed view of the distal rectum and anal verge was normal and       showed no anal or rectal abnormalities. Impression:           - One 5 mm polyp in the sigmoid colon, removed with a                        jumbo cold forceps. Resected and retrieved.                       - The examination was otherwise normal.                       - The rectum, sigmoid colon, descending colon,                        transverse colon, ascending colon and cecum are normal.                       - The distal rectum and anal verge are normal on  retroflexion view. Recommendation:       - Discharge patient to home (with escort).                       - Advance diet as tolerated.                       - Continue present medications.                       - Await pathology results.                       - Repeat colonoscopy in 5 years.                       - The findings and recommendations were discussed with                        the patient.                       - The findings and recommendations were  discussed with                        the patient's family.                       - Return to primary care physician as previously                        scheduled. Procedure Code(s):    --- Professional ---                       [id number], Colonoscopy, flexible; with biopsy, single or                        multiple Diagnosis Code(s):    --- Professional ---                       Z80.0, Family history of malignant neoplasm of                        digestive organs                       K63.5, Polyp of colon CPT copyright 2019 American Medical Association. All rights reserved. The codes documented in this report are preliminary and upon coder review may  be revised to meet current compliance requirements.  Melodie BouillonVarnita Shreyan Hinz, MD Michel BickersVarnita B. Maximino Greenlandahiliani MD, MD 09/11/2019 10:41:21 AM This report has been signed electronically. Number of Addenda: 0 Note Initiated On: 09/11/2019 9:42 AM Scope Withdrawal Time: 0 hours 14 minutes 54 seconds  Total Procedure Duration: 0 hours 17 minutes 18 seconds  Estimated Blood Loss: Estimated blood loss: none.      [facility]

## 2019-09-11 NOTE — H&P (Signed)
Melodie Bouillon, MD [address], [address], [place], [place], [address] [address], [place], [place], [address] Phone: [phone]  Fax: [phone]  Primary Care Physician:  Allegra Grana, FNP   Pre-Procedure History & Physical: HPI:  Judy Simmons is a 48 y.o. female is here for a colonoscopy and EGD.   Past Medical History:  Diagnosis Date  . Asthma   . Condyloma acuminatum   . Depression   . GERD (gastroesophageal reflux disease)   . LGSIL on Pap smear of cervix   . Obesity     Past Surgical History:  Procedure Laterality Date  . HYSTEROSCOPY W/D&C  04/26/2011   simple hyperplasia, endometrial polyp  . LAPAROSCOPIC GASTRIC SLEEVE RESECTION  2012  . PUBOVAGINAL SLING  04/03/2012   using TVTdevice  . TONSILLECTOMY AND ADENOIDECTOMY  2000  . TOTAL VAGINAL HYSTERECTOMY  04/03/2012   uterine prolapse    Prior to Admission medications   Medication Sig Start Date End Date Taking? Authorizing Provider  albuterol (VENTOLIN HFA) 108 (90 Base) MCG/ACT inhaler Inhale 1-2 puffs into the lungs every 6 (six) hours as needed for wheezing or shortness of breath. 07/09/19   Allegra Grana, FNP  bisacodyl (BISACODYL) 5 MG EC tablet Please take 2 tablets (10mg ) the day before procedure between 1pm and 3pm 08/15/19   08/17/19, MD  valACYclovir (VALTREX) 500 MG tablet Take 500 mg by mouth 2 (two) times daily.    [provider]  vortioxetine HBr (TRINTELLIX) 10 MG TABS tablet Take 1 tablet (10 mg total) by mouth daily. 06/20/19   Copland, Alicia B, PA-C    Allergies as of 08/16/2019 - Review Complete 08/15/2019  Allergen Reaction Noted  . Nsaids Other (See Comments) 06/16/2017  . Penicillin g Other (See Comments) 08/30/2014    Family History  Problem Relation Age of Onset  . Diabetes Father   . Colon cancer Father 35  . Alcohol abuse Daughter   . Drug abuse Daughter     Social History   Socioeconomic History  . Marital  status: Single    Spouse name: Not on file  . Number of children: 1  . Years of education: 37  . Highest education level: Not on file  Occupational History  . Occupation: 18  Social Needs  . Financial resource strain: Not on file  . Food insecurity    Worry: Not on file    Inability: Not on file  . Transportation needs    Medical: Not on file    Non-medical: Not on file  Tobacco Use  . Smoking status: Current Every Day Smoker  . Smokeless tobacco: Never Used  Substance and Sexual Activity  . Alcohol use: Yes  . Drug use: No  . Sexual activity: Yes    Birth control/protection: Surgical    Comment: Hysterectomy   Lifestyle  . Physical activity    Days per week: Not on file    Minutes per session: Not on file  . Stress: Not on file  Relationships  . Social Clinical biochemist on phone: Not on file    Gets together: Not on file    Attends religious service: Not on file    Active member of club or organization: Not on file    Attends meetings of clubs or organizations: Not on file    Relationship status: Not on file  . Intimate partner violence    Fear of current or ex partner: Not  on file    Emotionally abused: Not on file    Physically abused: Not on file    Forced sexual activity: Not on file  Other Topics Concern  . Not on file  Social History Narrative   Lives in [place]      Works- Naval architect      Living with parents      One daughter who has grandson       Review of Systems: See HPI, otherwise negative ROS  Physical Exam: BP 140/86   Pulse 71   Temp (!) 97 F (36.1 C) (Tympanic)   Resp 18   Ht 5\' 5"  (1.651 m)   Wt 110.2 kg   SpO2 100%   BMI 40.44 kg/m  General:   Alert,  pleasant and cooperative in NAD Head:  Normocephalic and atraumatic. Neck:  Supple; no masses or thyromegaly. Lungs:  Clear throughout to auscultation, normal respiratory effort.    Heart:  +S1, +S2, Regular rate and rhythm, No edema. Abdomen:   Soft, nontender and nondistended. Normal bowel sounds, without guarding, and without rebound.   Neurologic:  Alert and  oriented x4;  grossly normal neurologically.  Impression/Plan: Judy Simmons is here for a colonoscopy to be performed for high risk screening and EGD for Acid Reflux.  Risks, benefits, limitations, and alternatives regarding the procedures have been reviewed with the patient.  Questions have been answered.  All parties agreeable.   Virgel Manifold, MD  09/11/2019, 9:55 AM

## 2019-09-11 NOTE — Transfer of Care (Signed)
Immediate Anesthesia Transfer of Care Note  Patient: Judy Simmons  Procedure(s) Performed: COLONOSCOPY WITH PROPOFOL (N/A ) ESOPHAGOGASTRODUODENOSCOPY (EGD) WITH PROPOFOL (N/A )  Patient Location: Endoscopy Unit  Anesthesia Type:General  Level of Consciousness: awake and alert   Airway & Oxygen Therapy: Patient Spontanous Breathing and Patient connected to nasal cannula oxygen  Post-op Assessment: Report given to RN and Post -op Vital signs reviewed and stable  Post vital signs: Reviewed and stable  Last Vitals:  Vitals Value Taken Time  BP 103/54 09/11/19 1039  Temp 36.1 C 09/11/19 1039  Pulse 81 09/11/19 1040  Resp 17 09/11/19 1040  SpO2 96 % 09/11/19 1040  Vitals shown include unvalidated device data.  Last Pain:  Vitals:   09/11/19 1039  TempSrc: Tympanic  PainSc:          Complications: No apparent anesthesia complications

## 2019-09-11 NOTE — Anesthesia Post-op Follow-up Note (Signed)
Anesthesia QCDR form completed.        

## 2019-09-11 NOTE — Anesthesia Preprocedure Evaluation (Signed)
Anesthesia Evaluation  Patient identified by MRN, date of birth, ID band Patient awake    Reviewed: Allergy & Precautions, NPO status , Patient's Chart, lab work & pertinent test results  History of Anesthesia Complications Negative for: history of anesthetic complications  Airway Mallampati: III       Dental   Pulmonary asthma , neg sleep apnea (resolved after "weight-loss surgery"), Current Smoker,           Cardiovascular (-) hypertension(-) Past MI and (-) CHF (-) dysrhythmias (-) Valvular Problems/Murmurs     Neuro/Psych neg Seizures Depression    GI/Hepatic Neg liver ROS, GERD  ,  Endo/Other  neg diabetes  Renal/GU negative Renal ROS     Musculoskeletal   Abdominal   Peds  Hematology   Anesthesia Other Findings   Reproductive/Obstetrics                             Anesthesia Physical Anesthesia Plan  ASA: III  Anesthesia Plan: General   Post-op Pain Management:    Induction: Intravenous  PONV Risk Score and Plan: 2 and TIVA and Propofol infusion  Airway Management Planned: Nasal Cannula  Additional Equipment:   Intra-op Plan:   Post-operative Plan:   Informed Consent: I have reviewed the patients History and Physical, chart, labs and discussed the procedure including the risks, benefits and alternatives for the proposed anesthesia with the patient or authorized representative who has indicated his/her understanding and acceptance.       Plan Discussed with:   Anesthesia Plan Comments:         Anesthesia Quick Evaluation

## 2019-09-11 NOTE — Op Note (Signed)
[facility] Gastroenterology Patient Name: Judy Simmons Procedure Date: 09/11/2019 9:42 AM MRN: [id number] Account #: [id number] Date of Birth: [birth year] Admit Type: Outpatient Age: 48 Room: [facility] ENDO ROOM 2 Gender: Female Note Status: Finalized Procedure:            Upper GI endoscopy Indications:          Heartburn Providers:            Goodwin Kamphaus B. Maximino Greenland MD, MD Medicines:            Monitored Anesthesia Care Complications:        No immediate complications. Procedure:            Pre-Anesthesia Assessment:                       - Prior to the procedure, a History and Physical was                        performed, and patient medications, allergies and                        sensitivities were reviewed. The patient's tolerance of                        previous anesthesia was reviewed.                       - The risks and benefits of the procedure and the                        sedation options and risks were discussed with the                        patient. All questions were answered and informed                        consent was obtained.                       - Patient identification and proposed procedure were                        verified prior to the procedure by the physician, the                        nurse, the anesthesiologist, the anesthetist and the                        technician. The procedure was verified in the procedure                        room.                       - ASA Grade Assessment: II - A patient with mild                        systemic disease.                       After obtaining informed consent, the endoscope was  passed under direct vision. Throughout the procedure,                        the patient's blood pressure, pulse, and oxygen                        saturations were monitored continuously. The Endoscope                        was introduced through the mouth, and advanced to  the                        second part of duodenum. The upper GI endoscopy was                        accomplished with ease. The patient tolerated the                        procedure well. Findings:      LA Grade A (one or more mucosal breaks less than 5 mm, not extending       between tops of 2 mucosal folds) esophagitis with no bleeding was found       at the gastroesophageal junction.      A single 4 mm mucosal nodule with a localized distribution was found at       the gastroesophageal junction. Biopsies were taken with a cold forceps       for histology. This was seen on the Gastric side of the GE Junction.      The exam of the esophagus was otherwise normal.      A 1 cm hiatal hernia was present.      The entire examined stomach was normal. Biopsies were obtained in the       gastric body, at the incisura and in the gastric antrum with cold       forceps for histology. Biopsies were taken with a cold forceps for       Helicobacter pylori testing.      Patchy mildly erythematous mucosa without active bleeding and with no       stigmata of bleeding was found in the duodenal bulb.      The second portion of the duodenum was normal. Impression:           - LA Grade A reflux esophagitis.                       - Mucosal nodule found in the esophagus. Biopsied.                       - 1 cm hiatal hernia.                       - Normal stomach. Biopsied.                       - Erythematous duodenopathy.                       - Normal second portion of the duodenum.                       - Biopsies were obtained in the gastric body, at  the                        incisura and in the gastric antrum. Recommendation:       - Discharge patient to home (with escort).                       - Advance diet as tolerated.                       - Continue present medications.                       - Patient has a contact number available for                        emergencies. The signs and  symptoms of potential                        delayed complications were discussed with the patient.                        Return to normal activities tomorrow. Written discharge                        instructions were provided to the patient.                       - Discharge patient to home (with escort).                       - The findings and recommendations were discussed with                        the patient.                       - The findings and recommendations were discussed with                        the patient's family.                       - Follow an antireflux regimen.                       - Take prescribed proton pump inhibitor or H2 blocker                        (antacid) medications 30 - 60 minutes before meals. Procedure Code(s):    --- Professional ---                       [phone], Esophagogastroduodenoscopy, flexible, transoral;                        with biopsy, single or multiple Diagnosis Code(s):    --- Professional ---                       K21.0, Gastro-esophageal reflux disease with esophagitis                       K22.8, Other specified diseases  of esophagus                       K44.9, Diaphragmatic hernia without obstruction or                        gangrene                       K31.89, Other diseases of stomach and duodenum                       R12, Heartburn CPT copyright 2019 American Medical Association. All rights reserved. The codes documented in this report are preliminary and upon coder review may  be revised to meet current compliance requirements.  Melodie BouillonVarnita Aman Bonet, MD Michel BickersVarnita B. Maximino Greenlandahiliani MD, MD 09/11/2019 10:15:06 AM This report has been signed electronically. Number of Addenda: 0 Note Initiated On: 09/11/2019 9:42 AM Estimated Blood Loss: Estimated blood loss: none.      [facility]

## 2019-09-12 ENCOUNTER — Encounter: Payer: Self-pay | Admitting: Gastroenterology

## 2019-09-12 LAB — SURGICAL PATHOLOGY

## 2019-09-24 ENCOUNTER — Encounter: Payer: Self-pay | Admitting: Gastroenterology

## 2019-10-07 ENCOUNTER — Other Ambulatory Visit: Payer: Self-pay

## 2019-10-07 MED ORDER — OMEPRAZOLE 20 MG PO CPDR: 20.0000 mg | DELAYED_RELEASE_CAPSULE | Freq: Every day | ORAL | 0 refills | Status: DC

## 2019-10-07 NOTE — Telephone Encounter (Signed)
Last office visit 08/15/2019 GERD  Last refill 09/11/2019 0 refills

## 2019-11-06 DIAGNOSIS — M79671 Pain in right foot: Secondary | ICD-10-CM | POA: Diagnosis not present

## 2019-11-06 DIAGNOSIS — M722 Plantar fascial fibromatosis: Secondary | ICD-10-CM | POA: Diagnosis not present

## 2019-11-06 DIAGNOSIS — B351 Tinea unguium: Secondary | ICD-10-CM | POA: Diagnosis not present

## 2019-11-06 DIAGNOSIS — M7661 Achilles tendinitis, right leg: Secondary | ICD-10-CM | POA: Diagnosis not present

## 2019-11-06 DIAGNOSIS — M9261 Juvenile osteochondrosis of tarsus, right ankle: Secondary | ICD-10-CM | POA: Diagnosis not present

## 2019-11-06 DIAGNOSIS — M7731 Calcaneal spur, right foot: Secondary | ICD-10-CM | POA: Diagnosis not present

## 2019-11-14 ENCOUNTER — Ambulatory Visit: Payer: BC Managed Care – PPO | Admitting: Gastroenterology

## 2019-11-27 DIAGNOSIS — M7661 Achilles tendinitis, right leg: Secondary | ICD-10-CM | POA: Diagnosis not present

## 2019-11-27 DIAGNOSIS — M79671 Pain in right foot: Secondary | ICD-10-CM | POA: Diagnosis not present

## 2019-11-27 DIAGNOSIS — R945 Abnormal results of liver function studies: Secondary | ICD-10-CM | POA: Diagnosis not present

## 2019-11-27 DIAGNOSIS — M79672 Pain in left foot: Secondary | ICD-10-CM | POA: Diagnosis not present

## 2019-11-27 DIAGNOSIS — M9261 Juvenile osteochondrosis of tarsus, right ankle: Secondary | ICD-10-CM | POA: Diagnosis not present

## 2019-12-13 ENCOUNTER — Ambulatory Visit: Payer: BC Managed Care – PPO | Attending: Internal Medicine

## 2019-12-13 DIAGNOSIS — U071 COVID-19: Secondary | ICD-10-CM | POA: Insufficient documentation

## 2019-12-13 DIAGNOSIS — Z20822 Contact with and (suspected) exposure to covid-19: Secondary | ICD-10-CM

## 2019-12-14 LAB — NOVEL CORONAVIRUS, NAA: SARS-CoV-2, NAA: DETECTED — AB

## 2019-12-17 ENCOUNTER — Telehealth: Payer: Self-pay

## 2019-12-17 NOTE — Telephone Encounter (Signed)
Pt returning call. She got her covid test results from Mychart (positive) and would like to speak to a nurse. Sent msg to nurse triage.   Judy Simmons

## 2019-12-17 NOTE — Telephone Encounter (Signed)
Returned call to pt.  Advised of the positive COVID test result.  Pt. Stated she did see the results on MyChart.  Reported onset of symptoms of feeling tired/ weak and having cough on 12/12/19.  Also reported shortness of breath with activity, loss of smell and diminishing taste.  Denied fever.  Advised of CDC guidelines for self isolation/ ending isolation.  Advised of safe practice guidelines. Symptom Tier reviewed; encouraged to monitor for worsening symptoms, and to contact PCP if not improving.  Advised when to seek emergency care.  Instructed to rest and hydrate well.  Advised to leave the house during recommended isolation period, only if it is necessary to seek medical care.  Advised that other family members of household should quarantine x 14 days, due to direct exposure.  Advised the Health Dept. will be notified.  Pt. verb. understanding of instructions.

## 2020-03-24 DIAGNOSIS — Z20822 Contact with and (suspected) exposure to covid-19: Secondary | ICD-10-CM | POA: Diagnosis not present

## 2020-03-24 DIAGNOSIS — K529 Noninfective gastroenteritis and colitis, unspecified: Secondary | ICD-10-CM | POA: Diagnosis not present

## 2020-04-14 ENCOUNTER — Encounter: Payer: Self-pay | Admitting: Obstetrics and Gynecology

## 2020-04-18 ENCOUNTER — Ambulatory Visit: Payer: Self-pay | Attending: Internal Medicine

## 2020-04-18 DIAGNOSIS — Z23 Encounter for immunization: Secondary | ICD-10-CM

## 2020-04-18 NOTE — Progress Notes (Signed)
   Covid-19 Vaccination Clinic  Name:  Judy Simmons    MRN: [id number] DOB: [birth year]  04/18/2020  [name] was observed post Covid-19 immunization for 15 minutes without incident. She was provided with Vaccine Information Sheet and instruction to access the V-Safe system.   [name] was instructed to call 911 with any severe reactions post vaccine: Marland Kitchen Difficulty breathing  . Swelling of face and throat  . A fast heartbeat  . A bad rash all over body  . Dizziness and weakness   Immunizations Administered    Name Date Dose VIS Date Route   Pfizer COVID-19 Vaccine 04/18/2020  9:11 AM 0.3 mL 01/22/2019 Intramuscular   Manufacturer: [employer], [employer]   Lot: [id number]   NDC: [address]-5

## 2020-05-09 ENCOUNTER — Ambulatory Visit: Payer: Self-pay | Attending: Internal Medicine

## 2020-05-09 DIAGNOSIS — Z23 Encounter for immunization: Secondary | ICD-10-CM

## 2020-05-09 NOTE — Progress Notes (Signed)
   Covid-19 Vaccination Clinic  Name:  Judy Simmons    MRN: [id number] DOB: 02-04-1971  05/09/2020  [name] was observed post Covid-19 immunization for 15 minutes without incident. She was provided with Vaccine Information Sheet and instruction to access the V-Safe system.   [name] was instructed to call 911 with any severe reactions post vaccine: Marland Kitchen Difficulty breathing  . Swelling of face and throat  . A fast heartbeat  . A bad rash all over body  . Dizziness and weakness   Immunizations Administered    Name Date Dose VIS Date Route   Pfizer COVID-19 Vaccine 05/09/2020  9:11 AM 0.3 mL 01/22/2019 Intramuscular   Manufacturer: [employer], [employer]   Lot: [id number]   NDC: [address]-9

## 2020-05-19 DIAGNOSIS — J209 Acute bronchitis, unspecified: Secondary | ICD-10-CM | POA: Diagnosis not present

## 2020-05-19 DIAGNOSIS — B9689 Other specified bacterial agents as the cause of diseases classified elsewhere: Secondary | ICD-10-CM | POA: Diagnosis not present

## 2020-05-19 DIAGNOSIS — Z03818 Encounter for observation for suspected exposure to other biological agents ruled out: Secondary | ICD-10-CM | POA: Diagnosis not present

## 2020-05-19 DIAGNOSIS — J019 Acute sinusitis, unspecified: Secondary | ICD-10-CM | POA: Diagnosis not present

## 2020-09-17 ENCOUNTER — Encounter: Payer: Self-pay | Admitting: Family

## 2020-09-18 ENCOUNTER — Telehealth: Payer: Self-pay

## 2020-09-18 ENCOUNTER — Other Ambulatory Visit: Payer: Self-pay | Admitting: Family

## 2020-09-18 DIAGNOSIS — J069 Acute upper respiratory infection, unspecified: Secondary | ICD-10-CM

## 2020-09-18 DIAGNOSIS — J4541 Moderate persistent asthma with (acute) exacerbation: Secondary | ICD-10-CM

## 2020-09-18 MED ORDER — ALBUTEROL SULFATE HFA 108 (90 BASE) MCG/ACT IN AERS: 1.0000 | INHALATION_SPRAY | Freq: Four times a day (QID) | RESPIRATORY_TRACT | 2 refills | Status: DC | PRN

## 2020-09-18 NOTE — Telephone Encounter (Signed)
Patient out of town and having asthma attack. Is out of refills of her inhaler. Has requested thru PCP, but hasn't received response. [phone]

## 2020-09-18 NOTE — Telephone Encounter (Signed)
Patient called want to make sure Judy Simmons got the [employer] message for her albuterol (VENTOLIN HFA) 108 (90 Base) MCG/ACT inhaler she had a bad night

## 2020-09-18 NOTE — Telephone Encounter (Signed)
Chart reviewed. Encounter seen for orders only from PCP today. Meds list shows Albuterol erx at 12:23pm today. Spoke w/patient to be sure she is aware. She reports PCP called her just after she left message for [place]. She asked for verification of the pharmacy it was sent to. [facility] [phone] - [place], [place] - [address] [place]

## 2020-09-26 DIAGNOSIS — Z03818 Encounter for observation for suspected exposure to other biological agents ruled out: Secondary | ICD-10-CM | POA: Diagnosis not present

## 2020-09-26 DIAGNOSIS — J4521 Mild intermittent asthma with (acute) exacerbation: Secondary | ICD-10-CM | POA: Diagnosis not present

## 2020-10-28 ENCOUNTER — Telehealth: Payer: Self-pay | Admitting: Family

## 2020-10-28 NOTE — Telephone Encounter (Signed)
Pt called she is having sever thigh pain that runs to her knee. Not red or hot to the touch  Please call [phone]

## 2020-10-28 NOTE — Telephone Encounter (Signed)
Patient stated she has severe thigh px and in the back side down to her knee for couple weeks. She was working on hand and knees one day and struggled to get up. She is wondering if she pulled a muscle getting up of the ground. She thinks she has sciatica px due to the location. She stated it aggravates from her hip to her knee when she is driving. Patient has no other sx. Patient has not taken any medication at all. Appointment has been scheduled with Dr French Ana @ 0900 Friday.

## 2020-10-30 ENCOUNTER — Ambulatory Visit (INDEPENDENT_AMBULATORY_CARE_PROVIDER_SITE_OTHER): Payer: BC Managed Care – PPO

## 2020-10-30 ENCOUNTER — Other Ambulatory Visit: Payer: Self-pay

## 2020-10-30 ENCOUNTER — Encounter: Payer: Self-pay | Admitting: Internal Medicine

## 2020-10-30 ENCOUNTER — Ambulatory Visit: Payer: Self-pay | Admitting: Internal Medicine

## 2020-10-30 VITALS — BP 124/82 | HR 98 | Temp 98.3°F | Ht 65.0 in | Wt 274.0 lb

## 2020-10-30 DIAGNOSIS — M1711 Unilateral primary osteoarthritis, right knee: Secondary | ICD-10-CM | POA: Diagnosis not present

## 2020-10-30 DIAGNOSIS — M25561 Pain in right knee: Secondary | ICD-10-CM | POA: Diagnosis not present

## 2020-10-30 DIAGNOSIS — M25551 Pain in right hip: Secondary | ICD-10-CM

## 2020-10-30 DIAGNOSIS — M47816 Spondylosis without myelopathy or radiculopathy, lumbar region: Secondary | ICD-10-CM

## 2020-10-30 DIAGNOSIS — Z6841 Body Mass Index (BMI) 40.0 and over, adult: Secondary | ICD-10-CM

## 2020-10-30 MED ORDER — METHYLPREDNISOLONE ACETATE 80 MG/ML IJ SUSP
80.0000 mg | Freq: Once | INTRAMUSCULAR | Status: AC
  Administered 2020-10-30: 80 mg via INTRAMUSCULAR

## 2020-10-30 MED ORDER — TRAMADOL HCL 50 MG PO TABS: 50.0000 mg | ORAL_TABLET | Freq: Two times a day (BID) | ORAL | 0 refills | Status: AC | PRN

## 2020-10-30 NOTE — Progress Notes (Signed)
Chief Complaint  Patient presents with  . Knee Pain  . Leg Pain   1. Right thigh and posterior thigh pain x 2 weeks and right knee pain with driving pain worse 3/87 today max 11/10 and taking her foot off the pedal hurts worse. She has pain at the knee cap and at times hard getting up she also has h/o low back pain. She has numbness in toes of right foot at times. She also c/o right hip pain today intermittently Nothing tried   Xray low back  FINDINGS: Lumbar type vertebral bodies are well visualized. Mild scoliosis concave to the right is noted. Vertebral body height is well maintained. Mild osteophytic changes are seen. Disc space narrowing is noted at L3-4, L4-5 and L5-S1. No anterolisthesis is noted. No soft tissue abnormality is seen. Surgical changes are noted in the left upper quadrant.  IMPRESSION: Degenerative change without acute abnormality.   Electronically Signed   By: Alcide Clever M.D.   On: 04/13/2017 11:57  2. Wants flu shot but will defer due to steroid shot given today   Review of Systems  Respiratory: Negative for shortness of breath.   Cardiovascular: Negative for chest pain.  Musculoskeletal: Positive for back pain and joint pain. Negative for falls.   Past Medical History:  Diagnosis Date  . Asthma   . Condyloma acuminatum   . Depression   . GERD (gastroesophageal reflux disease)   . LGSIL on Pap smear of cervix   . Obesity    Past Surgical History:  Procedure Laterality Date  . COLONOSCOPY WITH PROPOFOL N/A 09/11/2019   Procedure: COLONOSCOPY WITH PROPOFOL;  Surgeon: Pasty Spillers, MD;  Location: [facility] END[facility]COPY;  Service: Endoscopy;  Laterality: N/A;  . ESOPHAGOGASTRODUODENOSCOPY (EGD) WITH PROPOFOL N/A 09/11/2019   Procedure: ESOPHAGOGASTRODUODENOSCOPY (EGD) WITH PROPOFOL;  Surgeon: Pasty Spillers, MD;  Location: [facility] ENDOSCOPY;  Service: Endoscopy;  Laterality: N/A;  . HYSTEROSCOPY WITH D & C  04/26/2011   simple hyperplasia,  endometrial polyp  . LAPAROSCOPIC GASTRIC SLEEVE RESECTION  2012  . PUBOVAGINAL SLING  04/03/2012   using TVTdevice  . TONSILLECTOMY AND ADENOIDECTOMY  2000  . TOTAL VAGINAL HYSTERECTOMY  04/03/2012   uterine prolapse   Family History  Problem Relation Age of Onset  . Diabetes Father   . Colon cancer Father 17  . Alcohol abuse Daughter   . Drug abuse Daughter    Social History   Socioeconomic History  . Marital status: Single    Spouse name: Not on file  . Number of children: 1  . Years of education: 58  . Highest education level: Not on file  Occupational History  . Occupation: customer service  Tobacco Use  . Smoking status: Current Every Day Smoker  . Smokeless tobacco: Never Used  Vaping Use  . Vaping Use: Never used  Substance and Sexual Activity  . Alcohol use: Yes  . Drug use: No  . Sexual activity: Yes    Birth control/protection: Surgical    Comment: Hysterectomy   Other Topics Concern  . Not on file  Social History Narrative   Lives in [place]      Works- Health visitor      Living with parents      One daughter who has grandson      International aid/development worker of [employer] Strain:   . Difficulty of Paying Living Expenses: Not on file  Food Insecurity:   . Worried About [employer] of  Food in the Last Year: Not on file  . Ran Out of Food in the Last Year: Not on file  Transportation Needs:   . Lack of Transportation (Medical): Not on file  . Lack of Transportation (Non-Medical): Not on file  Physical Activity:   . Days of Exercise per Week: Not on file  . Minutes of Exercise per Session: Not on file  Stress:   . Feeling of Stress : Not on file  Social Connections:   . Frequency of Communication with Friends and Family: Not on file  . Frequency of Social Gatherings with Friends and Family: Not on file  . Attends Religious Services: Not on file  . Active Member of Clubs or Organizations: Not on file  . Attends Tax inspector Meetings: Not on file  . Marital Status: Not on file  Intimate Partner Violence:   . Fear of Current or Ex-Partner: Not on file  . Emotionally Abused: Not on file  . Physically Abused: Not on file  . Sexually Abused: Not on file   Current Meds  Medication Sig  . albuterol (VENTOLIN HFA) 108 (90 Base) MCG/ACT inhaler Inhale 1-2 puffs into the lungs every 6 (six) hours as needed for wheezing or shortness of breath.   Allergies  Allergen Reactions  . Nsaids Other (See Comments)    Gastric Sleeve surgery  . Penicillin G Other (See Comments)    Rxn as a Child   No results found for this or any previous visit (from the past 2160 hour(s)). Objective  Body mass index is 45.6 kg/m. Wt Readings from Last 3 Encounters:  10/30/20 274 lb (124.3 kg)  09/11/19 243 lb (110.2 kg)  08/15/19 250 lb (113.4 kg)   Temp Readings from Last 3 Encounters:  10/30/20 98.3 F (36.8 C) (Oral)  09/11/19 (!) 97 F (36.1 C) (Tympanic)  08/15/19 98.2 F (36.8 C) (Oral)   BP Readings from Last 3 Encounters:  10/30/20 124/82  09/11/19 (!) 103/54  08/15/19 124/83   Pulse Readings from Last 3 Encounters:  10/30/20 98  09/11/19 64  08/15/19 81    Physical Exam Vitals and nursing note reviewed.  Constitutional:      Appearance: Normal appearance. She is well-developed and well-groomed. She is morbidly obese.  HENT:     Head: Normocephalic and atraumatic.  Eyes:     Conjunctiva/sclera: Conjunctivae normal.     Pupils: Pupils are equal, round, and reactive to light.  Cardiovascular:     Rate and Rhythm: Normal rate and regular rhythm.     Heart sounds: Normal heart sounds.  Pulmonary:     Effort: Pulmonary effort is normal.     Breath sounds: Normal breath sounds.  Musculoskeletal:     Right knee: Tenderness present over the medial joint line, lateral joint line and patellar tendon.  Skin:    General: Skin is warm and dry.  Neurological:     General: No focal deficit present.      Mental Status: She is alert and oriented to person, place, and time. Mental status is at baseline.     Gait: Gait normal.  Psychiatric:        Attention and Perception: Attention and perception normal.        Mood and Affect: Mood and affect normal.        Speech: Speech normal.        Behavior: Behavior normal. Behavior is cooperative.        Thought Content: Thought  content normal.        Cognition and Memory: Cognition and memory normal.        Judgment: Judgment normal.     Assessment  Plan  Acute pain of right knee - Plan: DG Knee Complete 4 Views Right +arthritis  negative DG Hip Unilat W OR W/O Pelvis 2-3 Views Right, methylPREDNISolone acetate (DEPO-MEDROL) injection 80 mg x1 ,  Referrals [facility]  Ambulatory referral to Physical Therapy, Ambulatory referral to Orthopedic Surgery, traMADol (ULTRAM) 50 MG tablet bid prn temp supply  Disc voltaren gel otc  otc tylenol  Bengay, biofreeze+, aspercream Lidocaine pain patch or salonpas pain patch  voltaren/Diclofenac Gel 4x per day   Arthritis, lumbar spine - Plan: methylPREDNISolone acetate (DEPO-MEDROL) injection 80 mg, Ambulatory referral to Physical Therapy, Ambulatory referral to Orthopedic Surgery, traMADol (ULTRAM) 50 MG tablet  Right hip pain Xray negative today - Plan: DG Knee Complete 4 Views Right, DG Hip Unilat W OR W/O Pelvis 2-3 Views Right, methylPREDNISolone acetate (DEPO-MEDROL) injection 80 mg, Ambulatory referral to Physical Therapy, Ambulatory referral to Orthopedic Surgery, traMADol (ULTRAM) 50 MG tablet  Morbid obesity with BMI of 45.0-49.9, adult (HCC)  -will need healthy diet and exercise to lose   rec flu shot in 2 weeks at pharmacy or call bac kfor the clinic appt  Provider: Dr. French Ana McLean-Scocuzza-Internal Medicine

## 2020-10-30 NOTE — Patient Instructions (Addendum)
Call for PT referral or ortho referral  [facility] or [facility]   [facility]    Bengay, biofreeze+, aspercream Lidocaine pain patch or salonpas pain patch  voltaren/Diclofenac Gel 4x per day   Flu shot at pharmacy later in 2 weeks    Plantar Fasciitis Rehab Ask your health care provider which exercises are safe for you. Do exercises exactly as told by your health care provider and adjust them as directed. It is normal to feel mild stretching, pulling, tightness, or discomfort as you do these exercises. Stop right away if you feel sudden pain or your pain gets worse. Do not begin these exercises until told by your health care provider. Stretching and range-of-motion exercises These exercises warm up your muscles and joints and improve the movement and flexibility of your foot. These exercises also help to relieve pain. Plantar fascia stretch  1. Sit with your left / right leg crossed over your opposite knee. 2. Hold your heel with one hand with that thumb near your arch. With your other hand, hold your toes and gently pull them back toward the top of your foot. You should feel a stretch on the bottom of your toes or your foot (plantar fascia) or both. 3. Hold this stretch for__________ seconds. 4. Slowly release your toes and return to the starting position. Repeat __________ times. Complete this exercise __________ times a day. Gastrocnemius stretch, standing This exercise is also called a calf (gastroc) stretch. It stretches the muscles in the back of the upper calf. 1. Stand with your hands against a wall. 2. Extend your left / right leg behind you, and bend your front knee slightly. 3. Keeping your heels on the floor and your back knee straight, shift your weight toward the wall. Do not arch your back. You should feel a gentle stretch in your upper left / right calf. 4. Hold this position for __________ seconds. Repeat __________ times. Complete this  exercise __________ times a day. Soleus stretch, standing This exercise is also called a calf (soleus) stretch. It stretches the muscles in the back of the lower calf. 1. Stand with your hands against a wall. 2. Extend your left / right leg behind you, and bend your front knee slightly. 3. Keeping your heels on the floor, bend your back knee and shift your weight slightly over your back leg. You should feel a gentle stretch deep in your lower calf. 4. Hold this position for __________ seconds. Repeat __________ times. Complete this exercise __________ times a day. Gastroc and soleus stretch, standing step This exercise stretches the muscles in the back of the lower leg. These muscles are in the upper calf (gastrocnemius) and the lower calf (soleus). 1. Stand with the ball of your left / right foot on a step. The ball of your foot is on the walking surface, right under your toes. 2. Keep your other foot firmly on the same step. 3. Hold on to the wall or a railing for balance. 4. Slowly lift your other foot, allowing your body weight to press your left / right heel down over the edge of the step. You should feel a stretch in your left / right calf. 5. Hold this position for __________ seconds. 6. Return both feet to the step. 7. Repeat this exercise with a slight bend in your left / right knee. Repeat __________ times with your left / right knee straight and __________ times with your left / right knee bent.  Complete this exercise __________ times a day. Balance exercise This exercise builds your balance and strength control of your arch to help take pressure off your plantar fascia. Single leg stand If this exercise is too easy, you can try it with your eyes closed or while standing on a pillow. 1. Without shoes, stand near a railing or in a doorway. You may hold on to the railing or door frame as needed. 2. Stand on your left / right foot. Keep your big toe down on the floor and try to keep  your arch lifted. Do not let your foot roll inward. 3. Hold this position for __________ seconds. Repeat __________ times. Complete this exercise __________ times a day. This information is not intended to replace advice given to you by your health care provider. Make sure you discuss any questions you have with your health care provider. Document Revised: 03/07/2019 Document Reviewed: 09/12/2018 [employer] Patient Education  2020 [employer]    Plantar Fasciitis  Plantar fasciitis is a painful foot condition that affects the heel. It occurs when the band of tissue that connects the toes to the heel bone (plantar fascia) becomes irritated. This can happen as the result of exercising too much or doing other repetitive activities (overuse injury). The pain from plantar fasciitis can range from mild irritation to severe pain that makes it difficult to walk or move. The pain is usually worse in the morning after sleeping, or after sitting or lying down for a while. Pain may also be worse after long periods of walking or standing. What are the causes? This condition may be caused by:  Standing for long periods of time.  Wearing shoes that do not have good arch support.  Doing activities that put stress on joints (high-impact activities), including running, aerobics, and ballet.  Being overweight.  An abnormal way of walking (gait).  Tight muscles in the back of your lower leg (calf).  High arches in your feet.  Starting a new athletic activity. What are the signs or symptoms? The main symptom of this condition is heel pain. Pain may:  Be worse with first steps after a time of rest, especially in the morning after sleeping or after you have been sitting or lying down for a while.  Be worse after long periods of standing still.  Decrease after 30-45 minutes of activity, such as gentle walking. How is this diagnosed? This condition may be diagnosed based on your medical history and  your symptoms. Your health care provider may ask questions about your activity level. Your health care provider will do a physical exam to check for:  A tender area on the bottom of your foot.  A high arch in your foot.  Pain when you move your foot.  Difficulty moving your foot. You may have imaging tests to confirm the diagnosis, such as:  X-rays.  Ultrasound.  MRI. How is this treated? Treatment for plantar fasciitis depends on how severe your condition is. Treatment may include:  Rest, ice, applying pressure (compression), and raising the affected foot (elevation). This may be called RICE therapy. Your health care provider may recommend RICE therapy along with over-the-counter pain medicines to manage your pain.  Exercises to stretch your calves and your plantar fascia.  A splint that holds your foot in a stretched, upward position while you sleep (night splint).  Physical therapy to relieve symptoms and prevent problems in the future.  Injections of steroid medicine (cortisone) to relieve pain and inflammation.  Stimulating your plantar fascia with electrical impulses (extracorporeal shock wave therapy). This is usually the last treatment option before surgery.  Surgery, if other treatments have not worked after 12 months. Follow these instructions at home:  Managing pain, stiffness, and swelling  If directed, put ice on the painful area: ? Put ice in a plastic bag, or use a frozen bottle of water. ? Place a towel between your skin and the bag or bottle. ? Roll the bottom of your foot over the bag or bottle. ? Do this for 20 minutes, 2-3 times a day.  Wear athletic shoes that have air-sole or gel-sole cushions, or try wearing soft shoe inserts that are designed for plantar fasciitis.  Raise (elevate) your foot above the level of your heart while you are sitting or lying down. Activity  Avoid activities that cause pain. Ask your health care provider what activities  are safe for you.  Do physical therapy exercises and stretches as told by your health care provider.  Try activities and forms of exercise that are easier on your joints (low-impact). Examples include swimming, water aerobics, and biking. General instructions  Take over-the-counter and prescription medicines only as told by your health care provider.  Wear a night splint while sleeping, if told by your health care provider. Loosen the splint if your toes tingle, become numb, or turn cold and blue.  Maintain a healthy weight, or work with your health care provider to lose weight as needed.  Keep all follow-up visits as told by your health care provider. This is important. Contact a health care provider if you:  Have symptoms that do not go away after caring for yourself at home.  Have pain that gets worse.  Have pain that affects your ability to move or do your daily activities. Summary  Plantar fasciitis is a painful foot condition that affects the heel. It occurs when the band of tissue that connects the toes to the heel bone (plantar fascia) becomes irritated.  The main symptom of this condition is heel pain that may be worse after exercising too much or standing still for a long time.  Treatment varies, but it usually starts with rest, ice, compression, and elevation (RICE therapy) and over-the-counter medicines to manage pain. This information is not intended to replace advice given to you by your health care provider. Make sure you discuss any questions you have with your health care provider. Document Revised: 10/27/2017 Document Reviewed: 09/11/2017 [employer] Patient Education  2020 [employer]   Hip Exercises Ask your health care provider which exercises are safe for you. Do exercises exactly as told by your health care provider and adjust them as directed. It is normal to feel mild stretching, pulling, tightness, or discomfort as you do these exercises. Stop right away if  you feel sudden pain or your pain gets worse. Do not begin these exercises until told by your health care provider. Stretching and range-of-motion exercises These exercises warm up your muscles and joints and improve the movement and flexibility of your hip. These exercises also help to relieve pain, numbness, and tingling. You may be asked to limit your range of motion if you had a hip replacement. Talk to your health care provider about these restrictions. Hamstrings, supine  1. Lie on your back (supine position). 2. Loop a belt or towel over the ball of your left / right foot. The ball of your foot is on the walking surface, right under your toes. 3. Straighten your  left / right knee and slowly pull on the belt or towel to raise your leg until you feel a gentle stretch behind your knee (hamstring). ? Do not let your knee bend while you do this. ? Keep your other leg flat on the floor. 4. Hold this position for __________ seconds. 5. Slowly return your leg to the starting position. Repeat __________ times. Complete this exercise __________ times a day. Hip rotation  1. Lie on your back on a firm surface. 2. With your left / right hand, gently pull your left / right knee toward the shoulder that is on the same side of the body. Stop when your knee is pointing toward the ceiling. 3. Hold your left / right ankle with your other hand. 4. Keeping your knee steady, gently pull your left / right ankle toward your other shoulder until you feel a stretch in your buttocks. ? Keep your hips and shoulders firmly planted while you do this stretch. 5. Hold this position for __________ seconds. Repeat __________ times. Complete this exercise __________ times a day. Seated stretch This exercise is sometimes called hamstrings and adductors stretch. 1. Sit on the floor with your legs stretched wide. Keep your knees straight during this exercise. 2. Keeping your head and back in a straight line, bend at your  waist to reach for your left foot (position A). You should feel a stretch in your right inner thigh (adductors). 3. Hold this position for __________ seconds. Then slowly return to the upright position. 4. Keeping your head and back in a straight line, bend at your waist to reach forward (position B). You should feel a stretch behind both of your thighs and knees (hamstrings). 5. Hold this position for __________ seconds. Then slowly return to the upright position. 6. Keeping your head and back in a straight line, bend at your waist to reach for your right foot (position C). You should feel a stretch in your left inner thigh (adductors). 7. Hold this position for __________ seconds. Then slowly return to the upright position. Repeat __________ times. Complete this exercise __________ times a day. Lunge This exercise stretches the muscles of the hip (hip flexors). 1. Place your left / right knee on the floor and bend your other knee so that is directly over your ankle. You should be half-kneeling. 2. Keep good posture with your head over your shoulders. 3. Tighten your buttocks to point your tailbone downward. This will prevent your back from arching too much. 4. You should feel a gentle stretch in the front of your left / right thigh and hip. If you do not feel a stretch, slide your other foot forward slightly and then slowly lunge forward with your chest up until your knee once again lines up over your ankle. ? Make sure your tailbone continues to point downward. 5. Hold this position for __________ seconds. 6. Slowly return to the starting position. Repeat __________ times. Complete this exercise __________ times a day. Strengthening exercises These exercises build strength and endurance in your hip. Endurance is the ability to use your muscles for a long time, even after they get tired. Bridge This exercise strengthens the muscles of your hip (hip extensors). 1. Lie on your back on a firm  surface with your knees bent and your feet flat on the floor. 2. Tighten your buttocks muscles and lift your bottom off the floor until the trunk of your body and your hips are level with your thighs. ? Do not  arch your back. ? You should feel the muscles working in your buttocks and the back of your thighs. If you do not feel these muscles, slide your feet 1-2 inches (2.5-5 cm) farther away from your buttocks. 3. Hold this position for __________ seconds. 4. Slowly lower your hips to the starting position. 5. Let your muscles relax completely between repetitions. Repeat __________ times. Complete this exercise __________ times a day. Straight leg raises, side-lying This exercise strengthens the muscles that move the hip joint away from the center of the body (hip abductors). 1. Lie on your side with your left / right leg in the top position. Lie so your head, shoulder, hip, and knee line up. You may bend your bottom knee slightly to help you balance. 2. Roll your hips slightly forward, so your hips are stacked directly over each other and your left / right knee is facing forward. 3. Leading with your heel, lift your top leg 4-6 inches (10-15 cm). You should feel the muscles in your top hip lifting. ? Do not let your foot drift forward. ? Do not let your knee roll toward the ceiling. 4. Hold this position for __________ seconds. 5. Slowly return to the starting position. 6. Let your muscles relax completely between repetitions. Repeat __________ times. Complete this exercise __________ times a day. Straight leg raises, side-lying This exercise strengthens the muscles that move the hip joint toward the center of the body (hip adductors). 1. Lie on your side with your left / right leg in the bottom position. Lie so your head, shoulder, hip, and knee line up. You may place your upper foot in front to help you balance. 2. Roll your hips slightly forward, so your hips are stacked directly over each  other and your left / right knee is facing forward. 3. Tense the muscles in your inner thigh and lift your bottom leg 4-6 inches (10-15 cm). 4. Hold this position for __________ seconds. 5. Slowly return to the starting position. 6. Let your muscles relax completely between repetitions. Repeat __________ times. Complete this exercise __________ times a day. Straight leg raises, supine This exercise strengthens the muscles in the front of your thigh (quadriceps). 1. Lie on your back (supine position) with your left / right leg extended and your other knee bent. 2. Tense the muscles in the front of your left / right thigh. You should see your kneecap slide up or see increased dimpling just above your knee. 3. Keep these muscles tight as you raise your leg 4-6 inches (10-15 cm) off the floor. Do not let your knee bend. 4. Hold this position for __________ seconds. 5. Keep these muscles tense as you lower your leg. 6. Relax the muscles slowly and completely between repetitions. Repeat __________ times. Complete this exercise __________ times a day. Hip abductors, standing This exercise strengthens the muscles that move the leg and hip joint away from the center of the body (hip abductors). 1. Tie one end of a rubber exercise band or tubing to a secure surface, such as a chair, table, or pole. 2. Loop the other end of the band or tubing around your left / right ankle. 3. Keeping your ankle with the band or tubing directly opposite the secured end, step away until there is tension in the tubing or band. Hold on to a chair, table, or pole as needed for balance. 4. Lift your left / right leg out to your side. While you do this: ? Keep your  back upright. ? Keep your shoulders over your hips. ? Keep your toes pointing forward. ? Make sure to use your hip muscles to slowly lift your leg. Do not tip your body or forcefully lift your leg. 5. Hold this position for __________ seconds. 6. Slowly return to  the starting position. Repeat __________ times. Complete this exercise __________ times a day. Squats This exercise strengthens the muscles in the front of your thigh (quadriceps). 1. Stand in a door frame so your feet and knees are in line with the frame. You may place your hands on the frame for balance. 2. Slowly bend your knees and lower your hips like you are going to sit in a chair. ? Keep your lower legs in a straight-up-and-down position. ? Do not let your hips go lower than your knees. ? Do not bend your knees lower than told by your health care provider. ? If your hip pain increases, do not bend as low. 3. Hold this position for ___________ seconds. 4. Slowly push with your legs to return to standing. Do not use your hands to pull yourself to standing. Repeat __________ times. Complete this exercise __________ times a day. This information is not intended to replace advice given to you by your health care provider. Make sure you discuss any questions you have with your health care provider. Document Revised: 06/20/2019 Document Reviewed: 09/25/2018 [employer] Patient Education  2020 [employer]  Back Exercises The following exercises strengthen the muscles that help to support the trunk and back. They also help to keep the lower back flexible. Doing these exercises can help to prevent back pain or lessen existing pain.  If you have back pain or discomfort, try doing these exercises 2-3 times each day or as told by your health care provider.  As your pain improves, do them once each day, but increase the number of times that you repeat the steps for each exercise (do more repetitions).  To prevent the recurrence of back pain, continue to do these exercises once each day or as told by your health care provider. Do exercises exactly as told by your health care provider and adjust them as directed. It is normal to feel mild stretching, pulling, tightness, or discomfort as you do these  exercises, but you should stop right away if you feel sudden pain or your pain gets worse. Exercises Single knee to chest Repeat these steps 3-5 times for each leg: 1. Lie on your back on a firm bed or the floor with your legs extended. 2. Bring one knee to your chest. Your other leg should stay extended and in contact with the floor. 3. Hold your knee in place by grabbing your knee or thigh with both hands and hold. 4. Pull on your knee until you feel a gentle stretch in your lower back or buttocks. 5. Hold the stretch for 10-30 seconds. 6. Slowly release and straighten your leg. Pelvic tilt Repeat these steps 5-10 times: 1. Lie on your back on a firm bed or the floor with your legs extended. 2. Bend your knees so they are pointing toward the ceiling and your feet are flat on the floor. 3. Tighten your lower abdominal muscles to press your lower back against the floor. This motion will tilt your pelvis so your tailbone points up toward the ceiling instead of pointing to your feet or the floor. 4. With gentle tension and even breathing, hold this position for 5-10 seconds. Cat-cow Repeat these steps until  your lower back becomes more flexible: 1. Get into a hands-and-knees position on a firm surface. Keep your hands under your shoulders, and keep your knees under your hips. You may place padding under your knees for comfort. 2. Let your head hang down toward your chest. Contract your abdominal muscles and point your tailbone toward the floor so your lower back becomes rounded like the back of a cat. 3. Hold this position for 5 seconds. 4. Slowly lift your head, let your abdominal muscles relax and point your tailbone up toward the ceiling so your back forms a sagging arch like the back of a cow. 5. Hold this position for 5 seconds.  Press-ups Repeat these steps 5-10 times: 1. Lie on your abdomen (face-down) on the floor. 2. Place your palms near your head, about shoulder-width  apart. 3. Keeping your back as relaxed as possible and keeping your hips on the floor, slowly straighten your arms to raise the top half of your body and lift your shoulders. Do not use your back muscles to raise your upper torso. You may adjust the placement of your hands to make yourself more comfortable. 4. Hold this position for 5 seconds while you keep your back relaxed. 5. Slowly return to lying flat on the floor.  Bridges Repeat these steps 10 times: 1. Lie on your back on a firm surface. 2. Bend your knees so they are pointing toward the ceiling and your feet are flat on the floor. Your arms should be flat at your sides, next to your body. 3. Tighten your buttocks muscles and lift your buttocks off the floor until your waist is at almost the same height as your knees. You should feel the muscles working in your buttocks and the back of your thighs. If you do not feel these muscles, slide your feet 1-2 inches farther away from your buttocks. 4. Hold this position for 3-5 seconds. 5. Slowly lower your hips to the starting position, and allow your buttocks muscles to relax completely. If this exercise is too easy, try doing it with your arms crossed over your chest. Abdominal crunches Repeat these steps 5-10 times: 1. Lie on your back on a firm bed or the floor with your legs extended. 2. Bend your knees so they are pointing toward the ceiling and your feet are flat on the floor. 3. Cross your arms over your chest. 4. Tip your chin slightly toward your chest without bending your neck. 5. Tighten your abdominal muscles and slowly raise your trunk (torso) high enough to lift your shoulder blades a tiny bit off the floor. Avoid raising your torso higher than that because it can put too much stress on your low back and does not help to strengthen your abdominal muscles. 6. Slowly return to your starting position. Back lifts Repeat these steps 5-10 times: 1. Lie on your abdomen (face-down)  with your arms at your sides, and rest your forehead on the floor. 2. Tighten the muscles in your legs and your buttocks. 3. Slowly lift your chest off the floor while you keep your hips pressed to the floor. Keep the back of your head in line with the curve in your back. Your eyes should be looking at the floor. 4. Hold this position for 3-5 seconds. 5. Slowly return to your starting position. Contact a health care provider if:  Your back pain or discomfort gets much worse when you do an exercise.  Your worsening back pain or discomfort does not  lessen within 2 hours after you exercise. If you have any of these problems, stop doing these exercises right away. Do not do them again unless your health care provider says that you can. Get help right away if:  You develop sudden, severe back pain. If this happens, stop doing the exercises right away. Do not do them again unless your health care provider says that you can. This information is not intended to replace advice given to you by your health care provider. Make sure you discuss any questions you have with your health care provider. Document Revised: 03/21/2019 Document Reviewed: 08/16/2018 Elsevier Patient Education  2020 [employer]  Knee Exercises Ask your health care provider which exercises are safe for you. Do exercises exactly as told by your health care provider and adjust them as directed. It is normal to feel mild stretching, pulling, tightness, or discomfort as you do these exercises. Stop right away if you feel sudden pain or your pain gets worse. Do not begin these exercises until told by your health care provider. Stretching and range-of-motion exercises These exercises warm up your muscles and joints and improve the movement and flexibility of your knee. These exercises also help to relieve pain and swelling. Knee extension, prone 6. Lie on your abdomen (prone position) on a bed. 7. Place your left / right knee just  beyond the edge of the surface so your knee is not on the bed. You can put a towel under your left / right thigh just above your kneecap for comfort. 8. Relax your leg muscles and allow gravity to straighten your knee (extension). You should feel a stretch behind your left / right knee. 9. Hold this position for __________ seconds. 10. Scoot up so your knee is supported between repetitions. Repeat __________ times. Complete this exercise __________ times a day. Knee flexion, active  6. Lie on your back with both legs straight. If this causes back discomfort, bend your left / right knee so your foot is flat on the floor. 7. Slowly slide your left / right heel back toward your buttocks. Stop when you feel a gentle stretch in the front of your knee or thigh (flexion). 8. Hold this position for __________ seconds. 9. Slowly slide your left / right heel back to the starting position. Repeat __________ times. Complete this exercise __________ times a day. Quadriceps stretch, prone  8. Lie on your abdomen on a firm surface, such as a bed or padded floor. 9. Bend your left / right knee and hold your ankle. If you cannot reach your ankle or pant leg, loop a belt around your foot and grab the belt instead. 10. Gently pull your heel toward your buttocks. Your knee should not slide out to the side. You should feel a stretch in the front of your thigh and knee (quadriceps). 11. Hold this position for __________ seconds. Repeat __________ times. Complete this exercise __________ times a day. Hamstring, supine 7. Lie on your back (supine position). 8. Loop a belt or towel over the ball of your left / right foot. The ball of your foot is on the walking surface, right under your toes. 9. Straighten your left / right knee and slowly pull on the belt to raise your leg until you feel a gentle stretch behind your knee (hamstring). ? Do not let your knee bend while you do this. ? Keep your other leg flat on the  floor. 10. Hold this position for __________ seconds. Repeat __________ times.  Complete this exercise __________ times a day. Strengthening exercises These exercises build strength and endurance in your knee. Endurance is the ability to use your muscles for a long time, even after they get tired. Quadriceps, isometric This exercise stretches the muscles in front of your thigh (quadriceps) without moving your knee joint (isometric). 6. Lie on your back with your left / right leg extended and your other knee bent. Put a rolled towel or small pillow under your knee if told by your health care provider. 7. Slowly tense the muscles in the front of your left / right thigh. You should see your kneecap slide up toward your hip or see increased dimpling just above the knee. This motion will push the back of the knee toward the floor. 8. For __________ seconds, hold the muscle as tight as you can without increasing your pain. 9. Relax the muscles slowly and completely. Repeat __________ times. Complete this exercise __________ times a day. Straight leg raises This exercise stretches the muscles in front of your thigh (quadriceps) and the muscles that move your hips (hip flexors). 7. Lie on your back with your left / right leg extended and your other knee bent. 8. Tense the muscles in the front of your left / right thigh. You should see your kneecap slide up or see increased dimpling just above the knee. Your thigh may even shake a bit. 9. Keep these muscles tight as you raise your leg 4-6 inches (10-15 cm) off the floor. Do not let your knee bend. 10. Hold this position for __________ seconds. 11. Keep these muscles tense as you lower your leg. 12. Relax your muscles slowly and completely after each repetition. Repeat __________ times. Complete this exercise __________ times a day. Hamstring, isometric 7. Lie on your back on a firm surface. 8. Bend your left / right knee about __________  degrees. 9. Dig your left / right heel into the surface as if you are trying to pull it toward your buttocks. Tighten the muscles in the back of your thighs (hamstring) to "dig" as hard as you can without increasing any pain. 10. Hold this position for __________ seconds. 11. Release the tension gradually and allow your muscles to relax completely for __________ seconds after each repetition. Repeat __________ times. Complete this exercise __________ times a day. Hamstring curls If told by your health care provider, do this exercise while wearing ankle weights. Begin with __________ lb weights. Then increase the weight by 1 lb (0.5 kg) increments. Do not wear ankle weights that are more than __________ lb. 7. Lie on your abdomen with your legs straight. 8. Bend your left / right knee as far as you can without feeling pain. Keep your hips flat against the floor. 9. Hold this position for __________ seconds. 10. Slowly lower your leg to the starting position. Repeat __________ times. Complete this exercise __________ times a day. Squats This exercise strengthens the muscles in front of your thigh and knee (quadriceps). 7. Stand in front of a table, with your feet and knees pointing straight ahead. You may rest your hands on the table for balance but not for support. 8. Slowly bend your knees and lower your hips like you are going to sit in a chair. ? Keep your weight over your heels, not over your toes. ? Keep your lower legs upright so they are parallel with the table legs. ? Do not let your hips go lower than your knees. ? Do not bend lower  than told by your health care provider. ? If your knee pain increases, do not bend as low. 9. Hold the squat position for __________ seconds. 10. Slowly push with your legs to return to standing. Do not use your hands to pull yourself to standing. Repeat __________ times. Complete this exercise __________ times a day. Wall slides This exercise strengthens  the muscles in front of your thigh and knee (quadriceps). 5. Lean your back against a smooth wall or door, and walk your feet out 18-24 inches (46-61 cm) from it. 6. Place your feet hip-width apart. 7. Slowly slide down the wall or door until your knees bend __________ degrees. Keep your knees over your heels, not over your toes. Keep your knees in line with your hips. 8. Hold this position for __________ seconds. Repeat __________ times. Complete this exercise __________ times a day. Straight leg raises This exercise strengthens the muscles that rotate the leg at the hip and move it away from your body (hip abductors). 1. Lie on your side with your left / right leg in the top position. Lie so your head, shoulder, knee, and hip line up. You may bend your bottom knee to help you keep your balance. 2. Roll your hips slightly forward so your hips are stacked directly over each other and your left / right knee is facing forward. 3. Leading with your heel, lift your top leg 4-6 inches (10-15 cm). You should feel the muscles in your outer hip lifting. ? Do not let your foot drift forward. ? Do not let your knee roll toward the ceiling. 4. Hold this position for __________ seconds. 5. Slowly return your leg to the starting position. 6. Let your muscles relax completely after each repetition. Repeat __________ times. Complete this exercise __________ times a day. Straight leg raises This exercise stretches the muscles that move your hips away from the front of the pelvis (hip extensors). 1. Lie on your abdomen on a firm surface. You can put a pillow under your hips if that is more comfortable. 2. Tense the muscles in your buttocks and lift your left / right leg about 4-6 inches (10-15 cm). Keep your knee straight as you lift your leg. 3. Hold this position for __________ seconds. 4. Slowly lower your leg to the starting position. 5. Let your leg relax completely after each repetition. Repeat  __________ times. Complete this exercise __________ times a day. This information is not intended to replace advice given to you by your health care provider. Make sure you discuss any questions you have with your health care provider. Document Revised: 09/04/2018 Document Reviewed: 09/04/2018 Elsevier Patient Education  2020 [employer].

## 2020-11-02 ENCOUNTER — Telehealth: Payer: Self-pay

## 2020-11-02 DIAGNOSIS — M47816 Spondylosis without myelopathy or radiculopathy, lumbar region: Secondary | ICD-10-CM | POA: Insufficient documentation

## 2020-11-02 DIAGNOSIS — M1711 Unilateral primary osteoarthritis, right knee: Secondary | ICD-10-CM | POA: Insufficient documentation

## 2020-11-02 NOTE — Telephone Encounter (Signed)
Unable to lvm to schedule follow up with PCP

## 2020-11-12 DIAGNOSIS — M5416 Radiculopathy, lumbar region: Secondary | ICD-10-CM | POA: Diagnosis not present

## 2020-11-12 DIAGNOSIS — M1711 Unilateral primary osteoarthritis, right knee: Secondary | ICD-10-CM | POA: Diagnosis not present

## 2020-11-12 DIAGNOSIS — M1611 Unilateral primary osteoarthritis, right hip: Secondary | ICD-10-CM | POA: Diagnosis not present

## 2020-11-12 DIAGNOSIS — M25551 Pain in right hip: Secondary | ICD-10-CM | POA: Diagnosis not present

## 2020-11-16 DIAGNOSIS — M25551 Pain in right hip: Secondary | ICD-10-CM | POA: Diagnosis not present

## 2021-01-13 DIAGNOSIS — H16403 Unspecified corneal neovascularization, bilateral: Secondary | ICD-10-CM | POA: Diagnosis not present

## 2021-01-13 DIAGNOSIS — H5213 Myopia, bilateral: Secondary | ICD-10-CM | POA: Diagnosis not present

## 2021-03-03 ENCOUNTER — Encounter: Payer: Self-pay | Admitting: Family

## 2021-03-04 ENCOUNTER — Other Ambulatory Visit: Payer: Self-pay

## 2021-03-04 DIAGNOSIS — J4541 Moderate persistent asthma with (acute) exacerbation: Secondary | ICD-10-CM

## 2021-03-04 DIAGNOSIS — J069 Acute upper respiratory infection, unspecified: Secondary | ICD-10-CM

## 2021-03-04 MED ORDER — CETIRIZINE HCL 10 MG PO TABS: 10.0000 mg | ORAL_TABLET | Freq: Every day | ORAL | 3 refills | Status: AC

## 2021-03-04 MED ORDER — ALBUTEROL SULFATE HFA 108 (90 BASE) MCG/ACT IN AERS: 1.0000 | INHALATION_SPRAY | Freq: Four times a day (QID) | RESPIRATORY_TRACT | 2 refills | Status: DC | PRN

## 2021-04-14 ENCOUNTER — Ambulatory Visit: Payer: BC Managed Care – PPO | Admitting: Family

## 2021-04-14 ENCOUNTER — Encounter: Payer: Self-pay | Admitting: Family

## 2021-04-21 NOTE — Telephone Encounter (Signed)
Noted, thanks sarah for the information

## 2021-04-29 ENCOUNTER — Telehealth: Payer: Self-pay | Admitting: Family

## 2021-04-29 NOTE — Telephone Encounter (Signed)
lft vm for pt to call ofc to f/u on referral for [facility] PT. thanks

## 2021-04-30 ENCOUNTER — Ambulatory Visit: Payer: BC Managed Care – PPO | Admitting: Family

## 2021-04-30 ENCOUNTER — Encounter: Payer: Self-pay | Admitting: Family

## 2021-04-30 ENCOUNTER — Ambulatory Visit (INDEPENDENT_AMBULATORY_CARE_PROVIDER_SITE_OTHER): Payer: BC Managed Care – PPO

## 2021-04-30 ENCOUNTER — Other Ambulatory Visit: Payer: Self-pay

## 2021-04-30 VITALS — BP 110/70 | HR 83 | Temp 97.6°F | Ht 65.0 in | Wt 283.0 lb

## 2021-04-30 DIAGNOSIS — R002 Palpitations: Secondary | ICD-10-CM | POA: Diagnosis not present

## 2021-04-30 DIAGNOSIS — F339 Major depressive disorder, recurrent, unspecified: Secondary | ICD-10-CM | POA: Diagnosis not present

## 2021-04-30 DIAGNOSIS — R413 Other amnesia: Secondary | ICD-10-CM | POA: Diagnosis not present

## 2021-04-30 DIAGNOSIS — K2100 Gastro-esophageal reflux disease with esophagitis, without bleeding: Secondary | ICD-10-CM

## 2021-04-30 DIAGNOSIS — R0789 Other chest pain: Secondary | ICD-10-CM

## 2021-04-30 LAB — CBC WITH DIFFERENTIAL/PLATELET
Basophils Absolute: 0.1 10*3/uL (ref 0.0–0.1)
Basophils Relative: 1.1 % (ref 0.0–3.0)
Eosinophils Absolute: 0.2 10*3/uL (ref 0.0–0.7)
Eosinophils Relative: 2.8 % (ref 0.0–5.0)
HCT: 39.7 % (ref 36.0–46.0)
Hemoglobin: 13.5 g/dL (ref 12.0–15.0)
Lymphocytes Relative: 32.9 % (ref 12.0–46.0)
Lymphs Abs: 1.8 10*3/uL (ref 0.7–4.0)
MCHC: 33.9 g/dL (ref 30.0–36.0)
MCV: 86.4 fl (ref 78.0–100.0)
Monocytes Absolute: 0.5 10*3/uL (ref 0.1–1.0)
Monocytes Relative: 9.9 % (ref 3.0–12.0)
Neutro Abs: 2.8 10*3/uL (ref 1.4–7.7)
Neutrophils Relative %: 53.3 % (ref 43.0–77.0)
Platelets: 311 10*3/uL (ref 150.0–400.0)
RBC: 4.6 Mil/uL (ref 3.87–5.11)
RDW: 14.3 % (ref 11.5–15.5)
WBC: 5.3 10*3/uL (ref 4.0–10.5)

## 2021-04-30 LAB — VITAMIN D 25 HYDROXY (VIT D DEFICIENCY, FRACTURES): VITD: 25.94 ng/mL — ABNORMAL LOW (ref 30.00–100.00)

## 2021-04-30 LAB — HEMOGLOBIN A1C: Hgb A1c MFr Bld: 5.4 % (ref 4.6–6.5)

## 2021-04-30 LAB — B12 AND FOLATE PANEL
Folate: 7.2 ng/mL (ref >5.9)
Vitamin B-12: 368 pg/mL (ref 211–911)

## 2021-04-30 LAB — TSH: TSH: 3.39 u[IU]/mL (ref 0.35–4.50)

## 2021-04-30 MED ORDER — OMEPRAZOLE 20 MG PO CPDR: 20.0000 mg | DELAYED_RELEASE_CAPSULE | Freq: Every day | ORAL | 1 refills | Status: DC

## 2021-04-30 MED ORDER — DICLOFENAC SODIUM 1 % EX GEL: 4.0000 g | Freq: Four times a day (QID) | CUTANEOUS | 3 refills | Status: AC

## 2021-04-30 NOTE — Assessment & Plan Note (Addendum)
Uncontrolled. She has not intent of suicide or suicide plan and discussed if these thoughts were to occur to call 911. Advised trial of prozac. She has declined. Referral to counselor. Close follow up.

## 2021-04-30 NOTE — Progress Notes (Signed)
Subjective:    Patient ID: Judy Simmons, female    DOB: 1971/07/18, 50 y.o.   MRN: [id number]  CC: Judy Simmons is a 50 y.o. female who presents today for an acute visit.    HPI: Complains of sudden onset of chest wall tenderness for 2 weeks, No acute changes with symptom, but symptom is present today. She feels tenderness with pressing across front and back of chest.  She can take topical nsaids.  Symptom is there all the time, she may not notice it as much.  No h/o DVT She is not on birth control.  No recent surgery.   No palpitations, cough, wheezing, sob, abdominal pain, leg swelling. No known  injury.  Hasnt tried albuterol.   Triggers for asthma include seasonal allergies. Not taking zyrtec  No formal exercise. She will home chores. No cp with house work.   Eats 'tums like candy' , eating several days per day for epigastric burning.   She also complains of memory loss in the last few months. She is forgetting passwords that she is using daily. She hasnt gotten lost or forgotten a loved one's name.  She sleeps 6 hrs per night however reports sleep being interrupted. Sleep is restorative. She doesn't snore.   Complains of more depression and anxiety over the past couple of years due to family stressors.  She has struggled with depression since she was 50 years old. She attempted suicide at 50 years old. She has not attempted to her hurt self in recent years. She lives for her mother and  Hayes Ludwig and 'would not hurt myself'.  Tried trintellix, wellbutrin without results and is not inclined to take medication for this. She has does counseling in the past which was helpful.   She has no suicide plan, or thoughts of harming herself or others.   Gun in home, stored safety.  Never been hospitalized for mental health.   She also complains 3 episodes of feeling disoriented and having  'pins and needles' all over the body.  First episode 4 weeks ago and then  second episode 2 days after the first episode; both occurred while driving. She didn't feel stress at that time. She had one episode at home most recently while sitting on the couch 2 weeks ago. Her mother was present.   She will go sleep, symptoms resolve,  and feels back to herself. She hasnt taken any medication for this.  No HA, vision changes, dizziness, vertigo, nausea, vomiting, fever, loss bowels, cp, palpitations.      No h/o seizure No h/o drug use.    Smoker  H/o gerd, asthma H/o gastric sleeve, hysterectomy  HISTORY:  Past Medical History:  Diagnosis Date  . Asthma   . Condyloma acuminatum   . Depression   . GERD (gastroesophageal reflux disease)   . LGSIL on Pap smear of cervix   . Obesity    Past Surgical History:  Procedure Laterality Date  . COLONOSCOPY WITH PROPOFOL N/A 09/11/2019   Procedure: COLONOSCOPY WITH PROPOFOL;  Surgeon: Pasty Spillers, MD;  Location: [facility] ENDOSCOPY;  Service: Endoscopy;  Laterality: N/A;  . ESOPHAGOGASTRODUODENOSCOPY (EGD) WITH PROPOFOL N/A 09/11/2019   Procedure: ESOPHAGOGASTRODUODENOSCOPY (EGD) WITH PROPOFOL;  Surgeon: Pasty Spillers, MD;  Location: [facility] ENDOSCOPY;  Service: Endoscopy;  Laterality: N/A;  . HYSTEROSCOPY WITH D & C  04/26/2011   simple hyperplasia, endometrial polyp  . LAPAROSCOPIC GASTRIC SLEEVE RESECTION  2012  . PUBOVAGINAL SLING  04/03/2012  using TVTdevice  . TONSILLECTOMY AND ADENOIDECTOMY  2000  . TOTAL VAGINAL HYSTERECTOMY  04/03/2012   uterine prolapse   Family History  Problem Relation Age of Onset  . Diabetes Father   . Colon cancer Father 56  . Alcohol abuse Daughter   . Drug abuse Daughter     Allergies: Nsaids and Penicillin g Current Outpatient Medications on File Prior to Visit  Medication Sig Dispense Refill  . albuterol (VENTOLIN HFA) 108 (90 Base) MCG/ACT inhaler Inhale 1-2 puffs into the lungs every 6 (six) hours as needed for wheezing or shortness of breath. 18 g 2  .  cetirizine (ZYRTEC) 10 MG tablet Take 1 tablet (10 mg total) by mouth daily. 30 tablet 3   No current facility-administered medications on file prior to visit.    Social History   Tobacco Use  . Smoking status: Current Every Day Smoker  . Smokeless tobacco: Never Used  Vaping Use  . Vaping Use: Never used  Substance Use Topics  . Alcohol use: Yes  . Drug use: No    Review of Systems  Constitutional: Negative for chills, fever and unexpected weight change.  HENT: Negative for congestion.   Eyes: Negative for visual disturbance.  Respiratory: Positive for chest tightness. Negative for cough and shortness of breath.   Cardiovascular: Negative for chest pain, palpitations and leg swelling.  Gastrointestinal: Negative for abdominal pain, diarrhea, nausea and vomiting.  Musculoskeletal: Negative for arthralgias and myalgias.  Skin: Negative for rash.  Neurological: Negative for dizziness and headaches.  Hematological: Negative for adenopathy.  Psychiatric/Behavioral: Positive for confusion. Negative for sleep disturbance and suicidal ideas. The patient is nervous/anxious.       Objective:    BP 110/70 (BP Location: Left Arm, Patient Position: Sitting, Cuff Size: Large)   Pulse 83   Temp 97.6 F (36.4 C) (Oral)   Ht 5\' 5"  (1.651 m)   Wt 283 lb (128.4 kg)   SpO2 98%   BMI 47.09 kg/m    Physical Exam Vitals reviewed.  Constitutional:      Appearance: She is well-developed.  HENT:     Mouth/Throat:     Pharynx: Uvula midline.  Eyes:     Conjunctiva/sclera: Conjunctivae normal.     Pupils: Pupils are equal, round, and reactive to light.     Comments: Fundus normal bilaterally.   Cardiovascular:     Rate and Rhythm: Normal rate and regular rhythm.     Pulses: Normal pulses.     Heart sounds: Normal heart sounds.  Pulmonary:     Effort: Pulmonary effort is normal.     Breath sounds: Normal breath sounds. No wheezing, rhonchi or rales.  Chest:       Comments:  Diffuse chest wall tenderness with palpation as marked on diagram.  Skin:    General: Skin is warm and dry.  Neurological:     Mental Status: She is alert.     Cranial Nerves: No cranial nerve deficit.     Sensory: No sensory deficit.     Deep Tendon Reflexes:     Reflex Scores:      Bicep reflexes are 2+ on the right side and 2+ on the left side.      Patellar reflexes are 2+ on the right side and 2+ on the left side.    Comments: Grip equal and strong bilateral upper extremities. Gait strong and steady. Able to perform rapid alternating movement without difficulty.   Psychiatric:  Speech: Speech normal.        Behavior: Behavior normal.        Thought Content: Thought content normal.        Assessment & Plan:   Problem List Items Addressed This Visit      Digestive   Gastroesophageal reflux disease with esophagitis without hemorrhage    Uncontrolled. Start omeprazole. Close follow up.         Other   Chest wall pain    Patient well appearing , no acute distress. Able to reproduce on exam. No sob, tachycardia. No risk wells criteria PE.  Discussed differential of costrocondritis. We also discussed GERD, asthma as contributing.  Advised heat, voltaren gel, prilosec and zyrtec ( asthma triggered by allergies).   EKG shows NSR. No acute changes. No prior EKG to compare too.         Relevant Medications   diclofenac Sodium (VOLTAREN) 1 % GEL   omeprazole (PRILOSEC) 20 MG capsule   Depression, recurrent (HCC) - Primary    Uncontrolled. She has not intent of suicide or suicide plan and discussed if these thoughts were to occur to call 911. Advised trial of prozac. She has declined. Referral to counselor. Close follow up.        Relevant Medications   diclofenac Sodium (VOLTAREN) 1 % GEL   Other Relevant Orders   Ambulatory referral to Psychology   DG Chest 2 View   TSH   CBC with Differential/Platelet   Comprehensive metabolic panel   Hemoglobin A1c    VITAMIN D 25 Hydroxy (Vit-D Deficiency, Fractures)   B12 and Folate Panel   Memory loss    New. Reassuring neurologic exam. 3 episodes of disorientation. Discussed depression as contributory. In setting of episodic disorientation, evaluation with neurology to exclude seizure. Pending MRI brain, labs, referral to neurology      Relevant Orders   MR BRAIN W WO CONTRAST   Ambulatory referral to Neurology    Other Visit Diagnoses    Palpitation       Relevant Orders   EKG 12-Lead (Completed)         I am having Alessandra Bevels. Tomaselli "[employer]" start on diclofenac Sodium and omeprazole. I am also having her maintain her albuterol and cetirizine.   Meds ordered this encounter  Medications  . diclofenac Sodium (VOLTAREN) 1 % GEL    Sig: Apply 4 g topically 4 (four) times daily.    Dispense:  50 g    Refill:  3    Order Specific Question:   Supervising Provider    Answer:   Darrick Huntsman, TERESA L [[id number]]  . omeprazole (PRILOSEC) 20 MG capsule    Sig: Take 1 capsule (20 mg total) by mouth daily. 30 minutes prior to breakfast    Dispense:  30 capsule    Refill:  1    Order Specific Question:   Supervising Provider    Answer:   Sherlene Shams [[id number]]    Return precautions given.   Risks, benefits, and alternatives of the medications and treatment plan prescribed today were discussed, and patient expressed understanding.   Education regarding symptom management and diagnosis given to patient on AVS.  Continue to follow with Allegra Grana, FNP for routine health maintenance.   Judy Simmons and I agreed with plan.   Rennie Plowman, FNP

## 2021-04-30 NOTE — Assessment & Plan Note (Signed)
Uncontrolled. Start omeprazole. Close follow up.

## 2021-04-30 NOTE — Assessment & Plan Note (Addendum)
New. Reassuring neurologic exam. 3 episodes of disorientation. Discussed depression as contributory. In setting of episodic disorientation, evaluation with neurology to exclude seizure. Pending MRI brain, labs, referral to neurology

## 2021-04-30 NOTE — Assessment & Plan Note (Addendum)
Patient well appearing , no acute distress. Able to reproduce on exam. No sob, tachycardia. No risk wells criteria PE.  Discussed differential of costrocondritis. We also discussed GERD, asthma as contributing.  Advised heat, voltaren gel, prilosec and zyrtec ( asthma triggered by allergies).   EKG shows NSR. No acute changes. No prior EKG to compare too.

## 2021-04-30 NOTE — Patient Instructions (Addendum)
Restart zyrtec to see if   Start prilosec  I ordered MRI brain, referred you to neurology and counseling.   Let us know if you dont hear back within a week in regards to an appointment being scheduled.    If you start to have unusual thoughts, thoughts of hurting yourself, or anyone else, please go immediately to the emergency department.   Follow up in 2 weeks.   Please text to [phone] and write the word 'home'. This will put you in touch with trained crisis counselor and resources.    National Suicide Prevention Hotline - available 24 hours a day, 7 days a week.  [phone]  Major Depressive Disorder Major depressive disorder is a mental illness. It also may be called clinical depression or unipolar depression. Major depressive disorder usually causes feelings of sadness, hopelessness, or helplessness. Some people with this disorder do not feel particularly sad but lose interest in doing things they used to enjoy (anhedonia). Major depressive disorder also can cause physical symptoms. It can interfere with work, school, relationships, and other normal everyday activities. The disorder varies in severity but is longer lasting and more serious than the sadness we all feel from time to time in our lives. Major depressive disorder often is triggered by stressful life events or major life changes. Examples of these triggers include divorce, loss of your job or home, a move, and the death of a family member or close friend. Sometimes this disorder occurs for no obvious reason at all. People who have family members with major depressive disorder or bipolar disorder are at higher risk for developing this disorder, with or without life stressors. Major depressive disorder can occur at any age. It may occur just once in your life (single episode major depressive disorder). It may occur multiple times (recurrent major depressive disorder). SYMPTOMS People with major depressive disorder have either  anhedonia or depressed mood on nearly a daily basis for at least 2 weeks or longer. Symptoms of depressed mood include:  Feelings of sadness (blue or down in the dumps) or emptiness.  Feelings of hopelessness or helplessness.  Tearfulness or episodes of crying (may be observed by others).  Irritability (children and adolescents). In addition to depressed mood or anhedonia or both, people with this disorder have at least four of the following symptoms:  Difficulty sleeping or sleeping too much.    Significant change (increase or decrease) in appetite or weight.    Lack of energy or motivation.  Feelings of guilt and worthlessness.    Difficulty concentrating, remembering, or making decisions.  Unusually slow movement (psychomotor retardation) or restlessness (as observed by others).    Recurrent wishes for death, recurrent thoughts of self-harm (suicide), or a suicide attempt. People with major depressive disorder commonly have persistent negative thoughts about themselves, other people, and the world. People with severe major depressive disorder may experience distorted beliefs or perceptions about the world (psychotic delusions). They also may see or hear things that are not real (psychotic hallucinations). DIAGNOSIS Major depressive disorder is diagnosed through an assessment by your health care provider. Your health care provider will ask about aspects of your daily life, such as mood, sleep, and appetite, to see if you have the diagnostic symptoms of major depressive disorder. Your health care provider may ask about your medical history and use of alcohol or drugs, including prescription medicines. Your health care provider also may do a physical exam and blood work. This is because certain medical conditions and  the use of certain substances can cause major depressive disorder-like symptoms (secondary depression). Your health care provider also may refer you to a mental health  specialist for further evaluation and treatment. TREATMENT It is important to recognize the symptoms of major depressive disorder and seek treatment. The following treatments can be prescribed for this disorder:    Medicine. Antidepressant medicines usually are prescribed. Antidepressant medicines are thought to correct chemical imbalances in the brain that are commonly associated with major depressive disorder. Other types of medicine may be added if the symptoms do not respond to antidepressant medicines alone or if psychotic delusions or hallucinations occur.  Talk therapy. Talk therapy can be helpful in treating major depressive disorder by providing support, education, and guidance. Certain types of talk therapy also can help with negative thinking (cognitive behavioral therapy) and with relationship issues that trigger this disorder (interpersonal therapy). A mental health specialist can help determine which treatment is best for you. Most people with major depressive disorder do well with a combination of medicine and talk therapy. Treatments involving electrical stimulation of the brain can be used in situations with extremely severe symptoms or when medicine and talk therapy do not work over time. These treatments include electroconvulsive therapy, transcranial magnetic stimulation, and vagal nerve stimulation.   This information is not intended to replace advice given to you by your health care provider. Make sure you discuss any questions you have with your health care provider.   Document Released: 03/11/2013 Document Revised: 12/05/2014 Document Reviewed: 03/11/2013 Elsevier Interactive Patient Education [employer]

## 2021-05-03 LAB — COMPREHENSIVE METABOLIC PANEL
ALT: 15 U/L (ref 0–35)
AST: 15 U/L (ref 0–37)
Albumin: 3.4 g/dL — ABNORMAL LOW (ref 3.5–5.2)
Alkaline Phosphatase: 67 U/L (ref 39–117)
BUN: 20 mg/dL (ref 6–23)
CO2: 21 mEq/L (ref 19–32)
Calcium: 8 mg/dL — ABNORMAL LOW (ref 8.4–10.5)
Chloride: 99 mEq/L (ref 96–112)
Creatinine, Ser: 0.66 mg/dL (ref 0.40–1.20)
GFR: 104.13 mL/min (ref >60.00)
Glucose, Bld: 71 mg/dL (ref 70–99)
Potassium: 4 mEq/L (ref 3.5–5.1)
Sodium: 133 mEq/L — ABNORMAL LOW (ref 135–145)
Total Bilirubin: 0.2 mg/dL (ref 0.2–1.2)
Total Protein: 6.2 g/dL (ref 6.0–8.3)

## 2021-05-06 ENCOUNTER — Other Ambulatory Visit (HOSPITAL_COMMUNITY)
Admission: RE | Admit: 2021-05-06 | Discharge: 2021-05-06 | Disposition: A | Discharge status: Home / Self Care | Payer: BC Managed Care – PPO | Source: Ambulatory Visit | Attending: Obstetrics and Gynecology | Admitting: Obstetrics and Gynecology

## 2021-05-06 ENCOUNTER — Encounter: Payer: Self-pay | Admitting: Obstetrics and Gynecology

## 2021-05-06 ENCOUNTER — Ambulatory Visit (INDEPENDENT_AMBULATORY_CARE_PROVIDER_SITE_OTHER): Payer: BC Managed Care – PPO | Admitting: Obstetrics and Gynecology

## 2021-05-06 ENCOUNTER — Other Ambulatory Visit: Payer: Self-pay

## 2021-05-06 VITALS — BP 110/80 | Ht 65.5 in | Wt 284.0 lb

## 2021-05-06 DIAGNOSIS — Z1151 Encounter for screening for human papillomavirus (HPV): Secondary | ICD-10-CM | POA: Diagnosis not present

## 2021-05-06 DIAGNOSIS — F419 Anxiety disorder, unspecified: Secondary | ICD-10-CM | POA: Insufficient documentation

## 2021-05-06 DIAGNOSIS — Z124 Encounter for screening for malignant neoplasm of cervix: Secondary | ICD-10-CM | POA: Diagnosis not present

## 2021-05-06 DIAGNOSIS — Z8742 Personal history of other diseases of the female genital tract: Secondary | ICD-10-CM | POA: Insufficient documentation

## 2021-05-06 DIAGNOSIS — Z01419 Encounter for gynecological examination (general) (routine) without abnormal findings: Secondary | ICD-10-CM

## 2021-05-06 DIAGNOSIS — F32A Depression, unspecified: Secondary | ICD-10-CM | POA: Insufficient documentation

## 2021-05-06 DIAGNOSIS — N3946 Mixed incontinence: Secondary | ICD-10-CM

## 2021-05-06 DIAGNOSIS — Z1231 Encounter for screening mammogram for malignant neoplasm of breast: Secondary | ICD-10-CM

## 2021-05-06 NOTE — Progress Notes (Signed)
PCP:  Allegra Grana, FNP   Chief Complaint  Patient presents with   Gynecologic Exam    No concerns     HPI:      Judy Simmons is a 50 y.o. G1P1001 who LMP was No LMP recorded. Patient has had a hysterectomy., presents today for her annual examination.  Her menses are absent due to Judy Simmons for uterine prolapse with pubovaginal sling.  Dysmenorrhea none. She does not have postmenopausal bleeding. Starting to have urin sx again of urgency, urge incont, occas SUI. Drinks caffeine.   Sex activity: not currently sexually active.  Last Pap: February 17, 2017  Results were: no abnormalities ; s/p LGSIL 2015 with HPV on bx. Hx of STDs: HPV Hx of vaginal lesion 2018 with neg HSV 2 IgG several times, last one 7/20. Had pos HSV 1/2 combo IgM.   Last mammogram: not recent There is no FH of breast cancer. There is no FH of ovarian cancer. The patient does do self-breast exams.  Tobacco use: few cigs daily Alcohol use: none No drug use.  Exercise: not active  Colonoscopy:  10/20 with polyp; repeat due after 5 yrs; FH colon cancer in her dad, genetic testing not done for pt  She does get adequate calcium but not Vitamin D in her diet. Labs with PCP with recent Vit D deficiency. Hasn't started supp yet.   Past Medical History:  Diagnosis Date   Asthma    Condyloma acuminatum    Depression    GERD (gastroesophageal reflux disease)    LGSIL on Pap smear of cervix    Obesity     Past Surgical History:  Procedure Laterality Date   COLONOSCOPY WITH PROPOFOL N/A 09/11/2019   Procedure: COLONOSCOPY WITH PROPOFOL;  Surgeon: Pasty Spillers, MD;  Location: [facility];  Service: Endoscopy;  Laterality: N/A;   ESOPHAGOGASTRODUODENOSCOPY (EGD) WITH PROPOFOL N/A 09/11/2019   Procedure: ESOPHAGOGASTRODUODENOSCOPY (EGD) WITH PROPOFOL;  Surgeon: Pasty Spillers, MD;  Location: [facility] END[facility]COPY;  Service: Endoscopy;  Laterality: N/A;   HYSTEROSCOPY WITH D & C  04/26/2011    simple hyperplasia, endometrial polyp   LAPAROSCOPIC GASTRIC SLEEVE RESECTION  2012   PUBOVAGINAL SLING  04/03/2012   using TVTdevice   TONSILLECTOMY AND ADENOIDECTOMY  2000   TOTAL VAGINAL HYSTERECTOMY  04/03/2012   uterine prolapse    Family History  Problem Relation Age of Onset   Diabetes Father    Colon cancer Father 47   Alcohol abuse Daughter    Drug abuse Daughter     Social History   Socioeconomic History   Marital status: Single    Spouse name: Not on file   Number of children: 1   Years of education: 14   Highest education level: Not on file  Occupational History   Occupation: customer service  Tobacco Use   Smoking status: Every Day    Pack years: 0.00   Smokeless tobacco: Never  Vaping Use   Vaping Use: Never used  Substance and Sexual Activity   Alcohol use: Yes   Drug use: No   Sexual activity: Not Currently    Birth control/protection: Surgical    Comment: Hysterectomy   Other Topics Concern   Not on file  Social History Narrative   Lives in [place]      Works- insurance and retail      Living with parents      One daughter who has grandson      Social Determinants  of Health   Financial Resource Strain: Not on file  Food Insecurity: Not on file  Transportation Needs: Not on file  Physical Activity: Not on file  Stress: Not on file  Social Connections: Not on file  Intimate Partner Violence: Not on file    Outpatient Medications Prior to Visit  Medication Sig Dispense Refill   albuterol (VENTOLIN HFA) 108 (90 Base) MCG/ACT inhaler Inhale 1-2 puffs into the lungs every 6 (six) hours as needed for wheezing or shortness of breath. 18 g 2   cetirizine (ZYRTEC) 10 MG tablet Take 1 tablet (10 mg total) by mouth daily. 30 tablet 3   diclofenac Sodium (VOLTAREN) 1 % GEL Apply 4 g topically 4 (four) times daily. (Patient not taking: Reported on 05/06/2021) 50 g 3   omeprazole (PRILOSEC) 20 MG capsule Take 1 capsule (20 mg total) by mouth  daily. 30 minutes prior to breakfast (Patient not taking: Reported on 05/06/2021) 30 capsule 1   No facility-administered medications prior to visit.      ROS:  Review of Systems  Constitutional:  Negative for fatigue, fever and unexpected weight change.  Respiratory:  Positive for shortness of breath. Negative for cough and wheezing.   Cardiovascular:  Positive for chest pain. Negative for palpitations and leg swelling.  Gastrointestinal:  Negative for blood in stool, constipation, diarrhea, nausea and vomiting.  Endocrine: Negative for cold intolerance, heat intolerance and polyuria.  Genitourinary:  Positive for difficulty urinating. Negative for dyspareunia, dysuria, flank pain, frequency, genital sores, hematuria, menstrual problem, pelvic pain, urgency, vaginal bleeding, vaginal discharge and vaginal pain.  Musculoskeletal:  Negative for back pain, joint swelling and myalgias.  Skin:  Negative for rash.  Neurological:  Negative for dizziness, syncope, light-headedness, numbness and headaches.  Hematological:  Negative for adenopathy.  Psychiatric/Behavioral:  Positive for dysphoric mood. Negative for agitation, confusion, sleep disturbance and suicidal ideas. The patient is not nervous/anxious.  BREAST: No symptoms   Objective: BP 110/80   Ht 5' 5.5" (1.664 m)   Wt 284 lb (128.8 kg)   BMI 46.54 kg/m    Physical Exam Constitutional:      Appearance: She is well-developed.  Genitourinary:     Vulva normal.     Genitourinary Comments: UTERUS/CX SURG REM     Right Labia: No rash, tenderness or lesions.    Left Labia: No tenderness, lesions or rash.    Vaginal cuff intact.    No vaginal discharge, erythema or tenderness.     No vaginal atrophy present.     Right Adnexa: not tender and no mass present.    Left Adnexa: not tender and no mass present.    Cervix is absent.     Uterus is absent.  Breasts:    Right: No mass, nipple discharge, skin change or tenderness.      Left: No mass, nipple discharge, skin change or tenderness.  Neck:     Thyroid: No thyromegaly.  Cardiovascular:     Rate and Rhythm: Normal rate and regular rhythm.     Heart sounds: Normal heart sounds. No murmur heard. Pulmonary:     Effort: Pulmonary effort is normal.     Breath sounds: Normal breath sounds.  Abdominal:     Palpations: Abdomen is soft.     Tenderness: There is no abdominal tenderness. There is no guarding.  Musculoskeletal:        General: Normal range of motion.     Cervical back: Normal range of motion.  Neurological:  General: No focal deficit present.     Mental Status: She is alert and oriented to person, place, and time.     Cranial Nerves: No cranial nerve deficit.  Skin:    General: Skin is warm and dry.  Psychiatric:        Mood and Affect: Mood normal.        Behavior: Behavior normal.        Thought Content: Thought content normal.        Judgment: Judgment normal.  Vitals reviewed.    Assessment/Plan: Encounter for annual routine gynecological examination  Cervical cancer screening - Plan: Cytology - PAP  Screening for HPV (human papillomavirus) - Plan: Cytology - PAP  History of abnormal cervical Pap smear - Plan: Cytology - PAP; repeat pap today  Encounter for screening mammogram for malignant neoplasm of breast - Plan: MM 3D SCREEN BREAST BILATERAL; pt to sched mammo  Mixed incontinence--Plan: Ambulatory referral to Physical Therapy; d/c caffeine, refer to pelvic PT first. May need OAB meds.          GYN counsel breast self exam, mammography screening, adequate intake of calcium and vitamin D, diet and exercise     F/U  Return in about 1 year (around 05/06/2022).  Emmi Wertheim B. Lashun Ramseyer, PA-C 05/06/2021 2:31 PM

## 2021-05-06 NOTE — Patient Instructions (Signed)
I value your feedback and you entrusting us with your care. If you get a  patient survey, I would appreciate you taking the time to let us know about your experience today. Thank you!  [facility]: [phone]  [facility]: [phone]    

## 2021-05-07 ENCOUNTER — Telehealth: Payer: Self-pay

## 2021-05-07 NOTE — Telephone Encounter (Signed)
LMTCB for labs. 

## 2021-05-10 ENCOUNTER — Encounter: Payer: Self-pay | Admitting: Family

## 2021-05-11 ENCOUNTER — Other Ambulatory Visit: Payer: Self-pay

## 2021-05-11 DIAGNOSIS — E871 Hypo-osmolality and hyponatremia: Secondary | ICD-10-CM

## 2021-05-11 LAB — CYTOLOGY - PAP
Comment: NEGATIVE
Diagnosis: UNDETERMINED — AB
High risk HPV: POSITIVE — AB

## 2021-05-12 ENCOUNTER — Encounter: Payer: Self-pay | Admitting: Obstetrics and Gynecology

## 2021-05-13 ENCOUNTER — Encounter: Payer: Self-pay | Admitting: Counselor

## 2021-05-14 ENCOUNTER — Ambulatory Visit: Payer: BC Managed Care – PPO

## 2021-05-14 ENCOUNTER — Ambulatory Visit: Payer: BC Managed Care – PPO | Admitting: Family

## 2021-05-14 NOTE — Addendum Note (Signed)
Addended by: Warden Fillers on: 05/14/2021 04:55 PM   Modules accepted: Orders

## 2021-05-18 ENCOUNTER — Other Ambulatory Visit: Payer: BC Managed Care – PPO

## 2021-05-18 NOTE — Telephone Encounter (Signed)
Pt's questions about pap smear answered.

## 2021-05-19 DIAGNOSIS — Z03818 Encounter for observation for suspected exposure to other biological agents ruled out: Secondary | ICD-10-CM | POA: Diagnosis not present

## 2021-05-19 DIAGNOSIS — J029 Acute pharyngitis, unspecified: Secondary | ICD-10-CM | POA: Diagnosis not present

## 2021-05-19 DIAGNOSIS — J209 Acute bronchitis, unspecified: Secondary | ICD-10-CM | POA: Diagnosis not present

## 2021-05-19 DIAGNOSIS — J019 Acute sinusitis, unspecified: Secondary | ICD-10-CM | POA: Diagnosis not present

## 2021-06-02 ENCOUNTER — Other Ambulatory Visit (INDEPENDENT_AMBULATORY_CARE_PROVIDER_SITE_OTHER): Payer: BC Managed Care – PPO

## 2021-06-02 ENCOUNTER — Other Ambulatory Visit: Payer: Self-pay

## 2021-06-02 DIAGNOSIS — E871 Hypo-osmolality and hyponatremia: Secondary | ICD-10-CM | POA: Diagnosis not present

## 2021-06-02 LAB — COMPREHENSIVE METABOLIC PANEL
ALT: 14 U/L (ref 0–35)
AST: 14 U/L (ref 0–37)
Albumin: 4.2 g/dL (ref 3.5–5.2)
Alkaline Phosphatase: 78 U/L (ref 39–117)
BUN: 17 mg/dL (ref 6–23)
CO2: 25 mEq/L (ref 19–32)
Calcium: 9.3 mg/dL (ref 8.4–10.5)
Chloride: 107 mEq/L (ref 96–112)
Creatinine, Ser: 0.68 mg/dL (ref 0.40–1.20)
GFR: 103.32 mL/min (ref >60.00)
Glucose, Bld: 77 mg/dL (ref 70–99)
Potassium: 4.1 mEq/L (ref 3.5–5.1)
Sodium: 140 mEq/L (ref 135–145)
Total Bilirubin: 0.4 mg/dL (ref 0.2–1.2)
Total Protein: 6.7 g/dL (ref 6.0–8.3)

## 2021-06-04 ENCOUNTER — Ambulatory Visit: Payer: Self-pay | Admitting: Family

## 2021-06-11 ENCOUNTER — Ambulatory Visit: Payer: BC Managed Care – PPO | Admitting: Family

## 2021-06-14 ENCOUNTER — Ambulatory Visit: Payer: BC Managed Care – PPO | Admitting: Family

## 2021-06-14 ENCOUNTER — Encounter: Payer: Self-pay | Admitting: Family

## 2021-06-14 ENCOUNTER — Other Ambulatory Visit: Payer: Self-pay

## 2021-06-14 VITALS — BP 112/82 | HR 77 | Temp 98.3°F | Ht 65.5 in | Wt 277.2 lb

## 2021-06-14 DIAGNOSIS — B351 Tinea unguium: Secondary | ICD-10-CM

## 2021-06-14 DIAGNOSIS — F32A Depression, unspecified: Secondary | ICD-10-CM

## 2021-06-14 DIAGNOSIS — J069 Acute upper respiratory infection, unspecified: Secondary | ICD-10-CM

## 2021-06-14 DIAGNOSIS — Z1231 Encounter for screening mammogram for malignant neoplasm of breast: Secondary | ICD-10-CM | POA: Diagnosis not present

## 2021-06-14 DIAGNOSIS — F419 Anxiety disorder, unspecified: Secondary | ICD-10-CM

## 2021-06-14 DIAGNOSIS — R413 Other amnesia: Secondary | ICD-10-CM

## 2021-06-14 DIAGNOSIS — J4541 Moderate persistent asthma with (acute) exacerbation: Secondary | ICD-10-CM

## 2021-06-14 MED ORDER — ALBUTEROL SULFATE HFA 108 (90 BASE) MCG/ACT IN AERS: 1.0000 | INHALATION_SPRAY | Freq: Four times a day (QID) | RESPIRATORY_TRACT | 2 refills | Status: DC | PRN

## 2021-06-14 MED ORDER — TERBINAFINE HCL 250 MG PO TABS: 250.0000 mg | ORAL_TABLET | Freq: Every day | ORAL | 0 refills | Status: DC

## 2021-06-14 NOTE — Progress Notes (Signed)
Subjective:    Patient ID: Judy Simmons, female    DOB: February 07, 1971, 50 y.o.   MRN: [id number]  CC: Judy Simmons is a 50 y.o. female who presents today for follow up.   HPI: Depression and anxiety has greatly improved since daughter has moved out and management at work has listened to her. She is happier.  Memory loss-  she feels that she is not forgetful like she was. She is remembering login/passwords like she had prior now. she never had MRI brain or saw neurology as she is not frustrated by this syncope. Sleeping well.  Epigastric and chest pain has resolved. She never started omeprazole   HISTORY:  Past Medical History:  Diagnosis Date   Asthma    Condyloma acuminatum    Depression    GERD (gastroesophageal reflux disease)    LGSIL on Pap smear of cervix    Obesity    Past Surgical History:  Procedure Laterality Date   COLONOSCOPY WITH PROPOFOL N/A 09/11/2019   Procedure: COLONOSCOPY WITH PROPOFOL;  Surgeon: Pasty Spillers, MD;  Location: [facility];  Service: Endoscopy;  Laterality: N/A;   ESOPHAGOGASTRODUODENOSCOPY (EGD) WITH PROPOFOL N/A 09/11/2019   Procedure: ESOPHAGOGASTRODUODENOSCOPY (EGD) WITH PROPOFOL;  Surgeon: Pasty Spillers, MD;  Location: [facility];  Service: Endoscopy;  Laterality: N/A;   HYSTEROSCOPY WITH D & C  04/26/2011   simple hyperplasia, endometrial polyp   LAPAROSCOPIC GASTRIC SLEEVE RESECTION  2012   PUBOVAGINAL SLING  04/03/2012   using TVTdevice   TONSILLECTOMY AND ADENOIDECTOMY  2000   TOTAL VAGINAL HYSTERECTOMY  04/03/2012   uterine prolapse   Family History  Problem Relation Age of Onset   Diabetes Father    Colon cancer Father 69   Alcohol abuse Daughter    Drug abuse Daughter     Allergies: Nsaids and Penicillin g Current Outpatient Medications on File Prior to Visit  Medication Sig Dispense Refill   cetirizine (ZYRTEC) 10 MG tablet Take 1 tablet (10 mg total) by mouth daily. 30 tablet 3    diclofenac Sodium (VOLTAREN) 1 % GEL Apply 4 g topically 4 (four) times daily. 50 g 3   No current facility-administered medications on file prior to visit.    Social History   Tobacco Use   Smoking status: Every Day   Smokeless tobacco: Never  Vaping Use   Vaping Use: Never used  Substance Use Topics   Alcohol use: Yes   Drug use: No    Review of Systems  Constitutional:  Negative for chills and fever.  Respiratory:  Negative for cough.   Cardiovascular:  Negative for chest pain and palpitations.  Gastrointestinal:  Negative for nausea and vomiting.     Objective:    BP 112/82 (BP Location: Left Arm, Patient Position: Sitting, Cuff Size: Large)   Pulse 77   Temp 98.3 F (36.8 C) (Oral)   Ht 5' 5.5" (1.664 m)   Wt 277 lb 3.2 oz (125.7 kg)   SpO2 97%   BMI 45.43 kg/m  BP Readings from Last 3 Encounters:  06/14/21 112/82  05/06/21 110/80  04/30/21 110/70   Wt Readings from Last 3 Encounters:  06/14/21 277 lb 3.2 oz (125.7 kg)  05/06/21 284 lb (128.8 kg)  04/30/21 283 lb (128.4 kg)    Physical Exam Vitals reviewed.  Constitutional:      Appearance: She is well-developed.  Eyes:     Conjunctiva/sclera: Conjunctivae normal.  Cardiovascular:     Rate and Rhythm:  Normal rate and regular rhythm.     Pulses: Normal pulses.     Heart sounds: Normal heart sounds.  Pulmonary:     Effort: Pulmonary effort is normal.     Breath sounds: Normal breath sounds. No wheezing, rhonchi or rales.  Musculoskeletal:       Feet:  Feet:     Right foot:     Toenail Condition: Right toenails are abnormally thick. Fungal disease present.    Comments: Thickened , yellow raised nail of 1st right toe and 2nd and third toenails. Skin:    General: Skin is warm and dry.  Neurological:     Mental Status: She is alert.  Psychiatric:        Speech: Speech normal.        Behavior: Behavior normal.        Thought Content: Thought content normal.       Assessment & Plan:   Problem  List Items Addressed This Visit       Musculoskeletal and Integument   Onychomycosis    Declines podiatry appointment. Start terbinafine for 12 weeks. Crt,  Liver function test normal 2 weeks ago.  hepatic panel in 6 weeks.       Relevant Medications   terbinafine (LAMISIL) 250 MG tablet   Other Relevant Orders   Comprehensive metabolic panel     Other   Anxiety and depression    Chronic, improved.  Patient again  declines trial of Prozac.  She would like to resume counseling and I provided her with number to schedule.        Memory loss    Resolved, patient declines any further work-up at this time.  She also  declines neurology consult, MRI of the brain.       Other Visit Diagnoses     Encounter for screening mammogram for malignant neoplasm of breast    -  Primary   Relevant Orders   MM 3D SCREEN BREAST BILATERAL        I have discontinued Judy Simmons. [name]'s omeprazole. I am also having her start on terbinafine. Additionally, I am having her maintain her cetirizine and diclofenac Sodium.   Meds ordered this encounter  Medications   terbinafine (LAMISIL) 250 MG tablet    Sig: Take 1 tablet (250 mg total) by mouth daily. 12 weeks of treatment    Dispense:  90 tablet    Refill:  0    Order Specific Question:   Supervising Provider    Answer:   Judy Simmons [[id number]]    Return precautions given.   Risks, benefits, and alternatives of the medications and treatment plan prescribed today were discussed, and patient expressed understanding.   Education regarding symptom management and diagnosis given to patient on AVS.  Continue to follow with Judy Grana, FNP for routine health maintenance.   Judy Simmons and I agreed with plan.   Rennie Plowman, FNP

## 2021-06-14 NOTE — Assessment & Plan Note (Signed)
Resolved, patient declines any further work-up at this time.  She also  declines neurology consult, MRI of the brain.

## 2021-06-14 NOTE — Assessment & Plan Note (Signed)
Chronic, improved.  Patient again  declines trial of Prozac.  She would like to resume counseling and I provided her with number to schedule.

## 2021-06-14 NOTE — Assessment & Plan Note (Addendum)
Declines podiatry appointment. Start terbinafine for 12 weeks. Crt,  Liver function test normal 2 weeks ago.  hepatic panel in 6 weeks.

## 2021-06-14 NOTE — Patient Instructions (Signed)
Start terbinafine

## 2021-06-16 NOTE — Progress Notes (Signed)
I have sent mychart message to patient.  °

## 2021-06-24 ENCOUNTER — Other Ambulatory Visit: Payer: Self-pay

## 2021-06-24 ENCOUNTER — Ambulatory Visit
Admission: RE | Admit: 2021-06-24 | Discharge: 2021-06-24 | Disposition: A | Discharge status: Home / Self Care | Payer: BC Managed Care – PPO | Source: Ambulatory Visit | Attending: Family | Admitting: Family

## 2021-06-24 DIAGNOSIS — Z1231 Encounter for screening mammogram for malignant neoplasm of breast: Secondary | ICD-10-CM

## 2021-07-15 ENCOUNTER — Encounter: Payer: Self-pay | Admitting: Counselor

## 2021-07-15 ENCOUNTER — Other Ambulatory Visit: Payer: Self-pay

## 2021-07-15 ENCOUNTER — Ambulatory Visit: Payer: BC Managed Care – PPO | Admitting: Counselor

## 2021-07-15 ENCOUNTER — Ambulatory Visit: Payer: BC Managed Care – PPO

## 2021-07-15 DIAGNOSIS — G3184 Mild cognitive impairment, so stated: Secondary | ICD-10-CM

## 2021-07-15 DIAGNOSIS — F329 Major depressive disorder, single episode, unspecified: Secondary | ICD-10-CM

## 2021-07-15 DIAGNOSIS — F32A Depression, unspecified: Secondary | ICD-10-CM

## 2021-07-15 DIAGNOSIS — F09 Unspecified mental disorder due to known physiological condition: Secondary | ICD-10-CM

## 2021-07-15 NOTE — Progress Notes (Signed)
NEUROPSYCHOLOGICAL TEST SCORES [facility]  Patient Name: DAYANNARA PASCAL MRN: [id number] Date of Birth: [birth year] Age: 50 y.o. Education: 14 years  Measurement properties of test scores: IQ, Index, and Standard Scores (SS): Mean = 100; Standard Deviation = 15 Scaled Scores (Ss): Mean = 10; Standard Deviation = 3 Z scores (Z): Mean = 0; Standard Deviation = 1 T scores (T); Mean = 50; Standard Deviation = 10  TEST SCORES:    Note: This summary of test scores accompanies the interpretive report and should not be interpreted by unqualified individuals or in isolation without reference to the report. Test scores are relative to age, gender, and educational history as available and appropriate.   Performance Validity        "A" Random Letter Test Raw  Descriptor      Errors 0 Within Expectation  The Dot Counting Test: 13 Within Expectation      Embedded Measures: Raw  Descriptor      NAB Effort Index 0 Within Expectation      Mental Status Screening     Total Score Descriptor  MoCA 29 Normal      Expected Functioning        Wide Range Achievement Test: Standard/Scaled Score Percentile      Word Reading 95 37      Reynolds Intellectual Screening Test Standard/T-score Percentile      Guess What 49 46      Odd Item Out 57 75  RIST Index 105 63      Attention/Processing Speed        Neuropsychological Assessment Battery (Attention Module, Form 1): Scaled/T-score Percentile      Digits Forward 54 66      Digits Backwards 41 18      Numbers & Letters A Speed 19 <1      Numbers & Letters A Accuracy 61 <1      Numbers & Letters A Efficiency 19 <1      Numbers & Letters B Efficiency 38 12      Numbers & Letters C Efficiency 43 25      Numbers & Letters D Efficiency 23 <1      Numbers & Letters D Disruption 45 31      Language        Neuropsychological Assessment Battery (Language Module, Form 1): T-score Percentile      Naming   (31) 54 66      Verbal  Fluency:  T Score Percentile      Controlled Oral Word Association (F-A-S) 42 21      Semantic Fluency (Animals) 41 18      Memory:        Neuropsychological Assessment Battery (Memory Module, Form 1): T-score/Standard Score Percentile  Memory Index (MEM): 102 55      List Learning           List A Immediate Recall   (5 , 6 , 9) 38 12         List B Immediate Recall   (6) 54 66         List A Short Delayed Recall   (8) 47 38         List A Long Delayed Recall   (8) 48 42         List A Percent Retention   (100 %) --- 50         List A Long Delayed Yes/No Recognition Hits   (  10) --- 21         List A Long Delayed Yes/No Recognition False Alarms   (0) --- 79         List A Recognition Discriminability Index --- 66      Shape Learning           Immediate Recognition   (6 , 8 , 9) 64 92         Delayed Recognition   (8) 62 88         Percent Retention   (89 %) --- 31         Delayed Forced-Choice Recognition Hits   (9) --- 84         Delayed Forced-Choice Recognition False Alarms   (0) --- 69         Delayed Forced-Choice Recognition Discriminability --- 84      Story Learning           Immediate Recall   (20, 31) 36 8         Delayed Recall   (31) 42 21         Percent Retention   (100 %) --- 69      Daily Living Memory            Immediate Recall   (26, 24) 68 96          Delayed Recall   (9, 7) 57 75          Percent Retention (94 %) --- 58          Recognition Hits   (10) --- 75      Visuospatial/Constructional Functioning        Neuropsychological Assessment Battery (Visuospatial Module) T-score Percentile      Visual Discrimination 60 84      Figure Copy 72 99      Executive Functioning        Modified Wisconsin Card Sorting Test (MWCST): Standard/T-Score Percentile      Number of Categories Correct 54 66      Number of Perseverative Errors 61 86      Number of Total Errors 71 98      Percent Perseverative Errors 60 84  Executive Function Composite 112 79      Trail  Making Test: T-Score Percentile      Part A 40 16      Part B 45 31      Boston Diagnostic Aphasia Exam: Raw Score Scaled Score      Complex Ideational Material 10 7      Clock Drawing Raw Score Descriptor      Command 10 WNL      Rating Scales         Raw Score Descriptor  Beck Depression Inventory - II 38 Severe  PROMIS Anxiety Questionnaire 60 Moderate   Kurstin Dimarzo V. Roseanne Reno PsyD, ABN Clinical Neuropsychologist

## 2021-07-15 NOTE — Progress Notes (Signed)
   Psychometrist Note   Cognitive testing was administered to Judy Simmons by Lamar Benes, B.S. (Technician) under the supervision of Alphonzo Severance, Psy.D., ABN. [name] was able to tolerate all test procedures. Dr. Nicole Kindred met with the patient as needed to manage any emotional reactions to the testing procedures. Rest breaks were offered.    The battery of tests administered was selected by Dr. Nicole Kindred with consideration to the patient's current level of functioning, the nature of her symptoms, emotional and behavioral responses during the interview, level of literacy, observed level of motivation/effort, and the nature of the referral question. This battery was communicated to the psychometrist. Communication between Dr. Nicole Kindred and the psychometrist was ongoing throughout the evaluation and Dr. Nicole Kindred was immediately accessible at all times. Dr. Nicole Kindred provided supervision to the technician on the date of this service, to the extent necessary to assure the quality of all services provided.    Judy Simmons with Dr. Nicole Kindred, at which time test performance, clinical impressions, and treatment recommendations will be reviewed in detail. The patient understands she can contact our office should she require our assistance before this time.   A total of 130 minutes of billable time were spent with Judy Simmons by the technician, including test administration and scoring time. Billing for these services is reflected in Dr. Les Pou note.   This note reflects time spent with the psychometrician and does not include test scores, clinical history, or any interpretations made by Dr. Nicole Kindred. The full report will follow in a separate note.

## 2021-07-15 NOTE — Progress Notes (Signed)
NEUROPSYCHOLOGICAL EVALUATION [facility]  Patient Name: Judy Simmons MRN: [id number] Date of Birth: [birth year] Age: 50 y.o. Education: 14 years  Referral Circumstances and Background Information  Judy Simmons is a 50 y.o., right-hand dominant, single woman with a history of depression and memory changes who was referred by her PCP, Rennie PlowmanMargaret Arnett, FNP for neuropsychological evaluation. The patient was initially concerned about subjective cognitive changes and forgetting passwords, although I see that since then her depression and anxiety have greatly improved and she is feeling better (as per last office visit note 06/14/2021).   On interview, the patient reported that she was "forgetting everything" since earlier this year, which she noticed around February. She was having a hard time remembering passwords, she was forgetting to do things, she was losing her train of thought mid sentence, and this seemed different than her typical issues with "getting distracted," which she has had for some time. She reported that she has a longstanding history of depression although it was much worse around that time, she had a lot of stress, and she was feeling overwhelmed. She was having a lot of problems with management at work, which have since been alleviated. She also was having a hard time with her daughter, which is an ongoing issue. She estimated that she is 80% back to normal and that she is doing much better, although she does continue to have some memory and thinking problems. From her description, it sounds like she has had minor cognitive issues for some time. She will occasionally forget to pay bills, she does not remember movies well, she also forgets sometimes to take medications. She has always been somewhat indecisive and will take "10 minutes" to decide what item to get at the grocery store, for example. She can misplace things and that has always been the case. On  specific review of symptoms, she denied significant changes with attention/concentration, with language, with memory, with organization, and she is not having difficulties with judgment and problem solving or decision making.   With respect to mood, the patient reported that she has "never" been happy. She reported that she "doesn't want to be here," and she has non-suicidal morbid ideation. This is chronic and she has felt this way for most of her life. She reported that she thinks about dying, but she has no specific plan or intent. Her ideation is passing, such as having thoughts of not wanting to wake up. She reported that she feels sad "all the time" and there is not much besides her grandchildren that gives her pleasure in life. She does not have many interests or past times. Her energy is normal for her, although she doesn't have a lot of energy. She has had problems sleeping "for years," she will wake up and then can't go back to sleep. She gets up almost every night for several hours, she thinks she is getting about 3 or 5 hours of sleep per night. Despite this, she is able to get through her day and doesn't feel particularly tired. The patient had bariatric surgery in 2012 and lost a significant amount of weight, she kept it off for years, and over the past 5 years she started putting weight back on. She had a particularly hard time after COVID hit. She has gained about 30lbs over the past 4 years and stated that she feels like her weight is "out of control" at present. She has diminished interest. She does draw, although she hasn't  been doing it much. She also enjoys making TikTok content. She baby sits her grandchildren frequently but otherwise does not have a lot of social involvement. She does have a romantic partner but "nothing serious."   The patient is still working full time, she works in [employer]	[place]. 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 She reported that she was having a hard time on the job but things are better now. She had a Production designer, theatre/television/film who was putting an excessive amount of work on her, which she had tolerated for years, although they got new employees and they were not as tolerant. They sought an administrative remedy and this has now improved. The workload is more reasonable. She reported that previously, she felt "so overwhelmed" at her job that she was having a hard time functioning, although she is doing much better now. She was talking about her issues with her coworkers, who she feels close with. She does not have any impairment in any other area of functioning, she is still managing her personal finances. Some bills are behind although it is not because she forgets them, it is related to finances. She is not getting lost or having accidents. She is able to take care of her house, such as doing chores etc. She did say that she has spotty recall for some things, for instance she makes deposits at work and will forget if she has made them, needing to check to see if it has been made physically. She notices this is worse when she is distracted. She is able to go grocery shopping although she is dependent on a list and will still "forget" things even with a list.   Past Medical History and Review of Relevant Studies   Patient Active Problem List   Diagnosis Date Noted   Onychomycosis 06/14/2021   History of abnormal cervical Pap smear 05/06/2021   Anxiety and depression 05/06/2021   Chest wall pain 04/30/2021   Memory loss 04/30/2021   Morbid obesity with BMI of 45.0-49.9, adult (HCC) 11/02/2020   Arthritis, lumbar spine 11/02/2020   Arthritis of knee, right 11/02/2020   Gastroesophageal reflux disease with esophagitis without hemorrhage    Hiatal hernia    Stomach irritation    FH: colon cancer    Polyp of sigmoid colon    Polymerase chain reaction DNA test positive for herpes simplex virus type 1 (HSV-1)  03/23/2018   Overactive bladder 12/20/2017   Right elbow pain 12/18/2017   Acute bronchitis 11/27/2017   DDD (degenerative disc disease), lumbar 06/30/2017   Asthma 02/26/2017   Obesity 02/26/2017   LGSIL Pap smear of vagina 02/26/2017   Acute right-sided low back pain with right-sided sciatica 01/23/2017   Depression, recurrent (HCC) 01/23/2017   Review of Neuroimaging and Relevant Medical History: MRI of the brain has been ordered but was not obtained.   The patient denied any history of head injuries, strokes, seizures, or other neurologically pertinent conditions.   Current Outpatient Medications  Medication Sig Dispense Refill   albuterol (VENTOLIN HFA) 108 (90 Base) MCG/ACT inhaler Inhale 1-2 puffs into the lungs every 6 (six) hours as needed for wheezing or shortness of breath. 18 g 2   cetirizine (ZYRTEC) 10 MG tablet Take 1 tablet (10 mg total) by mouth daily. 30 tablet 3   diclofenac Sodium (VOLTAREN) 1 % GEL Apply 4 g topically 4 (four) times daily. 50 g 3   terbinafine (LAMISIL) 250  MG tablet Take 1 tablet (250 mg total) by mouth daily. 12 weeks of treatment 90 tablet 0   No current facility-administered medications for this visit.   Family History  Problem Relation Age of Onset   Diabetes Father    Colon cancer Father 10   Alcohol abuse Daughter    Drug abuse Daughter    There is a family history of dementia. She had a paternal uncle that developed dementia at some point. She feels like her father is starting to exhibit signs of dementia, although it's not entirely clear because it is an atypical presentation. He has mostly paranoia, suspiciousness, and perseveration as opposed to memory loss. There is a family history of psychiatric illness, although no one else "admits" it and there has not been much in the way of treatment.  Psychosocial History  Developmental, Educational and Employment History: The patient is a native of [place] and has lived there her entire  life. She reported that she did mediocre in school, she reported that she earned mostly C's and struggled despite doing her work and putting in effort. This was in everything. She got tutoring in math and had to do summer school. She has never been held back. She went on to earn an associates degree in office systems from [employer] and she did much better there. She isn't sure why she did so much better but stated that she enjoyed it. She stated that she would love to go back to school. The patient has been at her present job for 18 years. Before that, she was working at [employer]. She continued working at [employer] until 2020 and had been working two jobs. She is working 40 hours a week now and was previously working 10, for many years. It sounds like she has a fair amount of financial stress.   Psychiatric History: The patient has been sporadically involved in various types of psychiatric treatment over the years. She has tried some antidepressants and stated that she has never found something that works for her. She eventually stopped medications several years ago, because she was having intolerable side effects, and decided it wasn't worth it. She denied doing much in the way of counseling, "it has been years." She has consulted with psychiatrists in the past. She estimated that she has not had any formal mental health treatment from a psychiatrist or psychologist/counselor in about 10 years. She has one history of suicide attempt via lethal ingestion when she was 14, although it was unsuccessful and she didn't need to go to the hospital. She didn't tell anyone for years. She had a plan to kill herself when she was 82 but felt as though the lord spoke to her the day she planned to carry out that plan, she got a pregnancy test, and she found out she was pregnant that day and never went through with it. She reported that her children and grandchildren are strong protective factors and she has no  attempts since. She has never been a psychiatric inpatient.   Substance Use History: The patient doesn't drink to any significant degree. She smokes about a pack of cigarettes a week, she has been smoking since she was 89 with a few breaks. She doesn't use any drugs.   Relationship History and Living Cimcumstances: The patient has never been married. She has been in some romantic relationships. She has one child and two grandchildren. She stated that she is not in a relationship.   Mental Status and  Behavioral Observations  Sensorium/Arousal: The patient's level of arousal was awake and alert. Hearing and vision were adequate for testing purposes. Orientation: Fully oriented to person, place, time, and situation Appearance: The patient was dressed in appropriate, casual clothing with reasonable grooming and hygiene.  Behavior: Appropriate, pleasant, during testing patient was cooperative but did seem to have a hard time speeding up on processing efficiency tasks as per technician Speech/language: Normal in rate, rhythm, volume and prosody Gait/Posture: Normal, narrow based on ambulation between rooms Movement: No bradykinesia, rest tremor or other concerning signs/symptoms Social Comportment: Pleasant and appropriate Mood: "I'm never happy" Affect: Dysphoric when talking about mood, otherwise appeared euthymic Thought process/content: Thought process was logical, linear, and goal-directed. She had no difficulties providing a detailed personal history or timeline. Content was appropriate.  Safety: Judy Simmons endorses passive ongoing chronic non-suicidal morbid ideation. She denied any intent or plan. She identifies her children and grandchildren as protective factors. These thoughts are chronic and ongoing for most of her life.  Insight: Judy Simmons Cognitive Assessment  07/15/2021  Visuospatial/ Executive (0/5) 5  Naming (0/3) 3  Attention: Read list of digits (0/2) 2  Attention: Read  list of letters (0/1) 1  Attention: Serial 7 subtraction starting at 100 (0/3) 3  Language: Repeat phrase (0/2) 2  Language : Fluency (0/1) 1  Abstraction (0/2) 2  Delayed Recall (0/5) 4  Orientation (0/6) 6  Total 29  Adjusted Score (based on education) 29   Test Procedures  Wide Range Achievement Test - 4             Word Reading [employer] Intellectual Screening Test Neuropsychological Assessment Battery  Memory Module  Naming  Digit Span  Numbers & Letters A - D  Visual Discrimination  Figure Copy The Dot Counting Test A Random Letter Test Controlled Oral Word Association (F-A-S) Semantic Fluency (Animals) Trail Making Test A & B Complex Ideational Material Modified [place] Card Sorting Test Geriatric Depression Scale - Short Form Quick Dementia Rating System  Plan  Judy Simmons was seen for a psychiatric diagnostic evaluation and neuropsychological testing. She is a pleasant, 50 year old, right-hand dominant woman with a history of chronic depression who was having significant thinking and memory problems several months ago. This was in the setting of increased stress, problems at work, and those have since been ameliorated. She thinks she is back to her prior baseline, for the most part, but does have some ongoing difficulties with thinking, memory, and the like. I conducted a full and complete informed consent with her regarding the risks and benefits of evaluation. Even though she is doing better, she wanted to proceed to establish a baseline and for reassurance/remediation of any issues. Full and complete note with impressions, recommendations, and interpretation of test data to follow.   Bettye Boeck Roseanne Reno, PsyD, ABN Clinical Neuropsychologist  Informed Consent  Risks and benefits of the evaluation were discussed with the patient prior to all testing procedures. I conducted a clinical interview and neuropsychological testing (at least two tests) with Judy Duck and Clare Charon, B.S. (Technician) administered additional test procedures. The patient was able to tolerate the testing procedures and the patient (and/or family if applicable) is likely to benefit from further follow up to receive the diagnosis and treatment recommendations, which will be rendered at the next encounter.

## 2021-07-16 NOTE — Progress Notes (Signed)
NEUROPSYCHOLOGICAL EVALUATION [facility]  Patient Name: Judy Simmons MRN: [id number] Date of Birth: [birth year] Age: 50 y.o. Education: 14 years  Clinical Impressions   Judy Simmons is a 50 y.o., right-hand dominant, single woman with a history of depression and cognitive changes that were noticed 5 months ago. She was under a "tremendous" amount of stress at that time, her mood symptoms were decompensating, and she started having a harder time functioning at work, such as with forgetting passwords and the like. Eventually, the issues at work that were triggering her were resolved, and she feels like she is doing better now. She does have a certain "baseline" level of cognitive complaints that result in forgetting to take medications, not being good at recalling movies, being indecisive and the like.  On neuropsychological testing, there is less than expected performance on measures of attention and processing efficiency although the pattern of findings is quite benign. It is difficult to tell if this is an impairment per se or more a result of her test-taking style, because there is significant speed accuracy tradeoff (I.e., she achieved good accuracy but was quite slow in doing so). Memory fell at a reasonable average level despite a few related low scores. She had normal performance in all other areas including very strong scores on measures of visuospatial/constructional ability and good performance on challenging measures of executive function. She scored in the moderate range for anxiety symptoms and the severe range for depression symptoms.   Judy Simmons is demonstrating very mild difficulties with respect to attention/processing efficiency that seem likely to be secondary to her depressive issues. I also wonder if she may not have some preexisting difficulties with attention/concentration given her report of longstanding minor cognitive problems and difficulties in  school despite adequate intellectual abilities although that is difficult to disentangle from her depressive illness, which is also a longstanding problem. Also, she is not sleeping well. She has no findings concerning for neurodegeneration. She may benefit from sleep hygiene, treatment of her mood disorder, and behavioral activation.   Diagnostic Impressions: Other symptoms and signs involving cognitive functions and awareness Major depressive disorder, recurrent, severe  Recommendations to be discussed with patient  Your performance and presentation on assessment were consistent with some very minor difficulties on measures of attention/processing speed, which are not very concerning. These sorts of difficulties are encountered commonly in individuals with depression, anxiety, and sleep problems, and I think that these modifiable risk factors/reversible causes are the most likely cause of your difficulties.  I would like to reassure you that there are no findings whatsoever that are concerning for an incipient neurodegenerative condition, such as Alzheimer's disease or another early onset dementia.  You performed completely within normal limits for you on memory measures, which are often the first thing to decline. You also did well in all other areas, including on challenging measures of executive abilities and visuospatial/constructional functioning. I have no concerns that your condition will worsen over time, and think rather that is likely will continue to improve with treatment of the underlying issues causing your memory and thinking problems.  First, you report a longstanding history of difficulties with absentmindedness, problems remembering things, and forget fullness. You also had a hard time in school despite putting forth good effort. These features make me wonder about pre-existing attention/hyperactivity issues, such as sometimes seen in individuals with ADHD, although that is difficult  to disentangle for your depression and anxiety, which may be a more parsimonious explanation. I  offer this observation just for your information, if you wished to get further evaluation and workup for ADHD you could consider doing so.   Second, I think getting your depression under better control would be the intervention of maximum impact for you. Depression can affect cognitive functioning in several ways. For one, there are neurobiological changes in depression that can contribute to attention, concentration, and memory problems. These changes are so common they are part of the criteria we use to diagnose depressive disorders. Depression can also negatively impact your own appraisal of your cognitive abilities, leading you to feel like you are performing more poorly than is objectively warranted. It is clear that you have had the experience of depression for much of your life, it is not new, and you have learned how to live with it however difficult that is. That does not mean that you need to live that way for the rest of your life. There are treatments that can be helpful for depression, including medications and psychotherapy, and I would urge you to consider engaging in 1 or both of those treatment modalities.  In addition to formal treatment, there are many things that you can do to help improve mood. There is a significant research base and evidence of effectiveness for something called "behavioral activation," which is a fancy way of saying that you should increase your activity level. In general, people do not feel as happy or do as well when they are not doing much. This can include things like getting out for walks, re-engaging in hobbies, spending time with family or friends, or learning a new hobby. It's not so important what you do as that you enjoy it and stick with it. Depression can start a vicious cycle where you are not doing a lot because you don't feel well, which leads to less things to  be excited and happy about, and thus more depression and behavioral avoidance. It can be hard to change this pattern once it has started but most people find that they feel better when they start doing more even if they don't enjoy it at first.   Exercises also extremely beneficial, both for cognitive functioning and for mood. You may wish to consider beginning exercise.   Test Findings  Test scores are summarized in additional documentation associated with this encounter. Test scores are relative to age, gender, and educational history as available and appropriate. There were no concerns about performance validity as all findings fell within normal expectations.   General Intellectual Functioning/Achievement:  Performance on single word reading was average. Performance on the RIST index was toward the upper portion of the average range, with high average performance on the more visually oriented portion of the test and average performance on the more verbally oriented portion.  Considering demographics and the above data, average presents as a reasonable standard of comparison for this patient's cognitive test data.  Attention and Processing Efficiency: Performance on indicators of attention/processing efficiency was somewhat below expectations for an individual of Ms. Westbrook's ability. It is difficult to tell if this represents bona fide "cognitive impairment", because there is evidence of significant speed accuracy trade off, but the number of low sub test scores is unusual nonetheless.   Digit repetition forward was average. Digit repetition backward was low average. On timed indicators involving processing efficiency, she demonstrated a significant speed accuracy trade off with extremely low speed yet high average accuracy on the most basic task, which involves letter cancellation. Her overall efficiency  score for this measure was extremely low. She demonstrated weak, low average performance on  a measure involving letter counting. Mental math under time pressure was low average. When alternating attention between mental math and letter cancellation, her performance was extremely low.  Language: Visual object confrontation naming was errorless. Generation of words was low average in response to phonemic and semantic prompts.  Visuospatial Function: Performance on visuospatial/constructional indicators was very good, with a high average score on a measure of fine visual discrimination and attention to detail. Copy of a modestly complex figure was very superior. These findings, in conjunction with Ms. Westbrook's performance on the RIST index, suggest visual abilities are an area of significant strength.  Learning and Memory: Performance on measures of learning and memory fell at a reasonable average level overall. She did have a few marginal scores on more challenging measures of learning, but the number of low subtest scores is not unusual.  In the verbal realm, Ms. Warshawsky learned 5, 6, and 9 words of a 12 item word list across 3 learning trials, which is low average. She retained 8 words on short and long delayed recall, which is average. Her recognition discriminability for target words versus foils was good, falling at an average level. Memory for structured information in the form of a short story was unusually low. Nevertheless, she retained this information well with a low average delayed recall. Memory for brief daily living type information was superior on immediate recall and high average on delayed recall. Recognition discriminability for this information was high average.  In the visual realm, Ms. Kessinger performed very well recalling 6, 8, and 9 designs of a possible 9 when learning a series of designs that are difficult to verbally encode. This is a superior range score. She retained 8 of these shapes on delayed recall, which is high average. Her yes/no recognition for these  designs was errorless, following at a high average level.  Executive Functions: Performance was good on most executive measures, with a high average score on the executive function composition of the modified [place] card sorting test. Alternating sequencing of numbers and letters of the alphabet was average. Reasoning with verbal information was low average on the complex ideational material. Clock drawing was good, with intact face, numbering, and hand placement. She scored at a low average level when generating words on the basis of the letters F-A-S.  Rating Scale(s): On self-rating scales of symptoms, Ms. Caldas reported a severe level of depressive symptoms and a moderate level of anxiety symptoms. Inspection of individual items shows some tendencies towards self-punitive thinking, and negative evaluation of the self in addition to other depressive symptomatology.  Bettye Boeck Roseanne Reno, PsyD, ABN Clinical Neuropsychologist  Coding and Compliance  Billing below reflects technician time, my direct face-to-face time with the patient, time spent in test administration, and time spent in professional activities including but not limited to: neuropsychological test interpretation, integration of neuropsychological test data with clinical history, report preparation, treatment planning, care coordination, and review of diagnostically pertinent medical history or studies.   Services associated with this encounter: Clinical Interview [phone]) plus 160 minutes (96132/96133; Neuropsychological Evaluation by Professional)  30 minutes (96136/96137; Test Administration by Professional) 130 minutes (96138/96139; Neuropsychological Testing by Technician)

## 2021-07-29 ENCOUNTER — Ambulatory Visit (INDEPENDENT_AMBULATORY_CARE_PROVIDER_SITE_OTHER): Payer: BC Managed Care – PPO | Admitting: Counselor

## 2021-07-29 ENCOUNTER — Other Ambulatory Visit: Payer: Self-pay

## 2021-07-29 ENCOUNTER — Encounter: Payer: Self-pay | Admitting: Counselor

## 2021-07-29 DIAGNOSIS — F332 Major depressive disorder, recurrent severe without psychotic features: Secondary | ICD-10-CM

## 2021-07-29 NOTE — Patient Instructions (Signed)
Your performance and presentation on assessment were consistent with some very minor difficulties on measures of attention/processing speed, which are not very concerning. These sorts of difficulties are encountered commonly in individuals with depression, anxiety, and sleep problems, and I think that these modifiable risk factors/reversible causes are the most likely cause of your difficulties.  I would like to reassure you that there are no findings whatsoever that are concerning for an incipient neurodegenerative condition, such as Alzheimer's disease or another early onset dementia.  You performed completely within normal limits for you on memory measures, which are often the first thing to decline. You also did well in all other areas, including on challenging measures of executive abilities and visuospatial/constructional functioning. I have no concerns that your condition will worsen over time, and think rather that is likely will continue to improve with treatment of the underlying issues causing your memory and thinking problems.  First, you report a longstanding history of difficulties with absentmindedness, problems remembering things, and forget fullness. You also had a hard time in school despite putting forth good effort. These features make me wonder about pre-existing attention/hyperactivity issues, such as sometimes seen in individuals with ADHD, although that is difficult to disentangle for your depression and anxiety, which may be a more parsimonious explanation. I offer this observation just for your information, if you wished to get further evaluation and workup for ADHD you could consider doing so. You could consider [facility] who are located at [address] in [place] and can be reached at [phone].   Second, I think getting your depression under better control would be the intervention of maximum impact for you. Depression can affect cognitive functioning in  several ways. For one, there are neurobiological changes in depression that can contribute to attention, concentration, and memory problems. These changes are so common they are part of the criteria we use to diagnose depressive disorders. Depression can also negatively impact your own appraisal of your cognitive abilities, leading you to feel like you are performing more poorly than is objectively warranted. It is clear that you have had the experience of depression for much of your life, it is not new, and you have learned how to live with it however difficult that is. That does not mean that you need to live that way for the rest of your life. There are treatments that can be helpful for depression, including medications and psychotherapy, and I would urge you to consider engaging in 1 or both of those treatment modalities.  In addition to formal treatment, there are many things that you can do to help improve mood. There is a significant research base and evidence of effectiveness for something called "behavioral activation," which is a fancy way of saying that you should increase your activity level. In general, people do not feel as happy or do as well when they are not doing much. This can include things like getting out for walks, re-engaging in hobbies, spending time with family or friends, or learning a new hobby. It's not so important what you do as that you enjoy it and stick with it. Depression can start a vicious cycle where you are not doing a lot because you don't feel well, which leads to less things to be excited and happy about, and thus more depression and behavioral avoidance. It can be hard to change this pattern once it has started but most people find that they feel better when they start doing more even  if they don't enjoy it at first.    Exercises also extremely beneficial, both for cognitive functioning and for mood. You may wish to consider beginning exercise.

## 2021-07-29 NOTE — Progress Notes (Signed)
NEUROPSYCHOLOGY FEEDBACK NOTE [facility]  Feedback Note: I met with Rodney Cruise to review the findings resulting from her neuropsychological evaluation. Since the last appointment, she has been about the same. She continues to have cognitive difficulties, she forgot once again if she had deposited money at work. She was able to verify that she had by checking deposit slipts. Time was spent reviewing the impressions and recommendations that are detailed in the evaluation report. I also took the opportunity to assess further regarding the possibility of ADHD. Ms. Tramontana reported that she did have problems focusing in class and they were noticed by teachers. She had a hard time paying attention. She did not have behavior problems but she did have a reputation for talking excessively. Because this was not the focus of her evaluation I do not feel I can offer a formal diagnosis, but I do think this is a hypothesis to potentially explore and I provided her with resources to pursue if she wishes. We discussed other topics, as reflected in the patient instructions. I took time to explain the findings and answer all the patient's questions. I encouraged Ms. Wuthrich to contact me should she have any further questions or if further follow up is desired.   Current Medications and Medical History   Current Outpatient Medications  Medication Sig Dispense Refill   albuterol (VENTOLIN HFA) 108 (90 Base) MCG/ACT inhaler Inhale 1-2 puffs into the lungs every 6 (six) hours as needed for wheezing or shortness of breath. 18 g 2   cetirizine (ZYRTEC) 10 MG tablet Take 1 tablet (10 mg total) by mouth daily. 30 tablet 3   diclofenac Sodium (VOLTAREN) 1 % GEL Apply 4 g topically 4 (four) times daily. 50 g 3   terbinafine (LAMISIL) 250 MG tablet Take 1 tablet (250 mg total) by mouth daily. 12 weeks of treatment 90 tablet 0   No current facility-administered medications for this visit.    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 Speech was normal in rate, rhythm, volume, and prosody. Self-reported mood was "normal, stable" and affect was mood congruent with moments of tearfulness as appropriate during discussion of sensitive topics. Thought process was logical and goal oriented and thought content was appropriate to the topics discussed. There were no safety concerns identified at today's encounter, such as thoughts of harming self or others.   Plan  Feedback provided regarding  the patient's neuropsychological evaluation. She is doing fairly well cognitively, with some minor difficulties on measures of attention/processing efficiency that could be due to any number of different factors including depression/anxiety and/or ADHD. KEYMIAH LYLES was encouraged to contact me if any questions arise or if further follow up is desired.    Viviano Simas Nicole Kindred, PsyD, ABN Clinical Neuropsychologist  Service(s) Provided at This Encounter: 54 minutes [phone]; Psychotherapy with patient/family)

## 2021-09-17 ENCOUNTER — Ambulatory Visit: Payer: BC Managed Care – PPO | Admitting: Family

## 2021-10-28 ENCOUNTER — Ambulatory Visit
Admission: EM | Admit: 2021-10-28 | Discharge: 2021-10-28 | Disposition: A | Discharge status: Home / Self Care | Payer: BC Managed Care – PPO | Attending: Physician Assistant | Admitting: Physician Assistant

## 2021-10-28 ENCOUNTER — Encounter: Payer: Self-pay | Admitting: Emergency Medicine

## 2021-10-28 ENCOUNTER — Other Ambulatory Visit: Payer: Self-pay

## 2021-10-28 DIAGNOSIS — B349 Viral infection, unspecified: Secondary | ICD-10-CM | POA: Diagnosis not present

## 2021-10-28 DIAGNOSIS — R0981 Nasal congestion: Secondary | ICD-10-CM | POA: Insufficient documentation

## 2021-10-28 DIAGNOSIS — Z20822 Contact with and (suspected) exposure to covid-19: Secondary | ICD-10-CM | POA: Diagnosis not present

## 2021-10-28 DIAGNOSIS — J45909 Unspecified asthma, uncomplicated: Secondary | ICD-10-CM | POA: Insufficient documentation

## 2021-10-28 DIAGNOSIS — Z7952 Long term (current) use of systemic steroids: Secondary | ICD-10-CM | POA: Insufficient documentation

## 2021-10-28 DIAGNOSIS — R051 Acute cough: Secondary | ICD-10-CM | POA: Diagnosis not present

## 2021-10-28 LAB — RESP PANEL BY RT-PCR (FLU A&B, COVID) ARPGX2
Influenza A by PCR: NEGATIVE
Influenza B by PCR: NEGATIVE
SARS Coronavirus 2 by RT PCR: NEGATIVE

## 2021-10-28 MED ORDER — PREDNISONE 20 MG PO TABS: 40.0000 mg | ORAL_TABLET | Freq: Every day | ORAL | 0 refills | Status: AC

## 2021-10-28 MED ORDER — PSEUDOEPH-BROMPHEN-DM 30-2-10 MG/5ML PO SYRP: 10.0000 mL | ORAL_SOLUTION | Freq: Four times a day (QID) | ORAL | 0 refills | Status: AC | PRN

## 2021-10-28 NOTE — ED Provider Notes (Signed)
[facility]    CSN: [id number] Arrival date & time: 10/28/21  0944      History   Chief Complaint Chief Complaint  Patient presents with   Cough    HPI Judy Simmons is a 50 y.o. female presenting for 2 to 3-day history of fatigue, cough, congestion, sore throat and headaches.  She denies any fevers.  Patient admits to some shortness of breath but says that she always has shortness of breath.  Has used her asthma inhaler without any improvement in her cough or breathing.  No significant shortness of breath.  Has been exposed to her granddaughter who had a cold last week.  She was negative for COVID and flu.  Patient has taken over-the-counter Mucinex and says she has been able to cough up some mucus.  No other complaints.  HPI  Past Medical History:  Diagnosis Date   Asthma    Condyloma acuminatum    Depression    GERD (gastroesophageal reflux disease)    LGSIL on Pap smear of cervix    Obesity     Patient Active Problem List   Diagnosis Date Noted   Onychomycosis 06/14/2021   History of abnormal cervical Pap smear 05/06/2021   Anxiety and depression 05/06/2021   Chest wall pain 04/30/2021   Memory loss 04/30/2021   Morbid obesity with BMI of 45.0-49.9, adult (HCC) 11/02/2020   Arthritis, lumbar spine 11/02/2020   Arthritis of knee, right 11/02/2020   Gastroesophageal reflux disease with esophagitis without hemorrhage    Hiatal hernia    Stomach irritation    FH: colon cancer    Polyp of sigmoid colon    Polymerase chain reaction DNA test positive for herpes simplex virus type 1 (HSV-1) 03/23/2018   Overactive bladder 12/20/2017   Right elbow pain 12/18/2017   Acute bronchitis 11/27/2017   DDD (degenerative disc disease), lumbar 06/30/2017   Asthma 02/26/2017   Obesity 02/26/2017   LGSIL Pap smear of vagina 02/26/2017   Acute right-sided low back pain with right-sided sciatica 01/23/2017   Depression, recurrent (HCC) 01/23/2017    Past  Surgical History:  Procedure Laterality Date   COLONOSCOPY WITH PROPOFOL N/A 09/11/2019   Procedure: COLONOSCOPY WITH PROPOFOL;  Surgeon: Pasty Spillers, MD;  Location: [facility];  Service: Endoscopy;  Laterality: N/A;   ESOPHAGOGASTRODUODENOSCOPY (EGD) WITH PROPOFOL N/A 09/11/2019   Procedure: ESOPHAGOGASTRODUODENOSCOPY (EGD) WITH PROPOFOL;  Surgeon: Pasty Spillers, MD;  Location: [facility];  Service: Endoscopy;  Laterality: N/A;   HYSTEROSCOPY WITH D & C  04/26/2011   simple hyperplasia, endometrial polyp   LAPAROSCOPIC GASTRIC SLEEVE RESECTION  2012   PUBOVAGINAL SLING  04/03/2012   using TVTdevice   TONSILLECTOMY AND ADENOIDECTOMY  2000   TOTAL VAGINAL HYSTERECTOMY  04/03/2012   uterine prolapse    OB History     Gravida  1   Para  1   Term  1   Preterm      AB      Living  1      SAB      IAB      Ectopic      Multiple      Live Births  1            Home Medications    Prior to Admission medications   Medication Sig Start Date End Date Taking? Authorizing Provider  albuterol (VENTOLIN HFA) 108 (90 Base) MCG/ACT inhaler Inhale 1-2 puffs into the lungs every 6 (  six) hours as needed for wheezing or shortness of breath. 06/14/21  Yes Allegra Grana, FNP  brompheniramine-pseudoephedrine-DM 30-2-10 MG/5ML syrup Take 10 mLs by mouth 4 (four) times daily as needed for up to 7 days. 10/28/21 11/04/21 Yes Shirlee Latch, PA-C  predniSONE (DELTASONE) 20 MG tablet Take 2 tablets (40 mg total) by mouth daily for 5 days. 10/28/21 11/02/21 Yes Shirlee Latch, PA-C  cetirizine (ZYRTEC) 10 MG tablet Take 1 tablet (10 mg total) by mouth daily. 03/04/21   Allegra Grana, FNP  diclofenac Sodium (VOLTAREN) 1 % GEL Apply 4 g topically 4 (four) times daily. 04/30/21   Allegra Grana, FNP  terbinafine (LAMISIL) 250 MG tablet Take 1 tablet (250 mg total) by mouth daily. 12 weeks of treatment 06/14/21   Allegra Grana, FNP    Family History Family  History  Problem Relation Age of Onset   Diabetes Father    Colon cancer Father 52   Alcohol abuse Daughter    Drug abuse Daughter     Social History Social History   Tobacco Use   Smoking status: Every Day    Packs/day: 0.50    Types: Cigarettes   Smokeless tobacco: Never  Vaping Use   Vaping Use: Never used  Substance Use Topics   Alcohol use: Yes   Drug use: No     Allergies   Nsaids and Penicillin g   Review of Systems Review of Systems  Constitutional:  Positive for fatigue. Negative for chills, diaphoresis and fever.  HENT:  Positive for congestion, rhinorrhea and sore throat. Negative for ear pain, sinus pressure and sinus pain.   Respiratory:  Positive for cough and shortness of breath.   Gastrointestinal:  Negative for abdominal pain, nausea and vomiting.  Musculoskeletal:  Negative for arthralgias and myalgias.  Skin:  Negative for rash.  Neurological:  Positive for headaches. Negative for weakness.  Hematological:  Negative for adenopathy.    Physical Exam Triage Vital Signs ED Triage Vitals  Enc Vitals Group     BP      Pulse      Resp      Temp      Temp src      SpO2      Weight      Height      Head Circumference      Peak Flow      Pain Score      Pain Loc      Pain Edu?      Excl. in GC?    No data found.  Updated Vital Signs BP (!) 135/96 (BP Location: Right Arm)   Pulse 67   Temp 97.9 F (36.6 C) (Oral)   Resp 18   Ht 5' 5.5" (1.664 m)   Wt 277 lb 1.9 oz (125.7 kg)   SpO2 100%   BMI 45.41 kg/m     Physical Exam Vitals and nursing note reviewed.  Constitutional:      General: She is not in acute distress.    Appearance: Normal appearance. She is ill-appearing. She is not toxic-appearing.  HENT:     Head: Normocephalic and atraumatic.     Right Ear: Tympanic membrane, ear canal and external ear normal.     Left Ear: Tympanic membrane, ear canal and external ear normal.     Nose: Congestion and rhinorrhea present.      Mouth/Throat:     Mouth: Mucous membranes are moist.  Pharynx: Oropharynx is clear. Posterior oropharyngeal erythema present.  Eyes:     General: No scleral icterus.       Right eye: No discharge.        Left eye: No discharge.     Conjunctiva/sclera: Conjunctivae normal.  Cardiovascular:     Rate and Rhythm: Normal rate and regular rhythm.     Heart sounds: Normal heart sounds.  Pulmonary:     Effort: Pulmonary effort is normal. No respiratory distress.     Breath sounds: Normal breath sounds.  Musculoskeletal:     Cervical back: Neck supple.  Skin:    General: Skin is dry.  Neurological:     General: No focal deficit present.     Mental Status: She is alert. Mental status is at baseline.     Motor: No weakness.     Gait: Gait normal.  Psychiatric:        Mood and Affect: Mood normal.        Behavior: Behavior normal.        Thought Content: Thought content normal.     UC Treatments / Results  Labs (all labs ordered are listed, but only abnormal results are displayed) Labs Reviewed  RESP PANEL BY RT-PCR (FLU A&B, COVID) [id number]    EKG   Radiology No results found.  Procedures Procedures (including critical care time)  Medications Ordered in UC Medications - No data to display  Initial Impression / Assessment and Plan / UC Course  I have reviewed the triage vital signs and the nursing notes.  Pertinent labs & imaging results that were available during my care of the patient were reviewed by me and considered in my medical decision making (see chart for details).  50 year old female presenting for 2 to 3-day history of fatigue, cough, headaches, congestion and sore throat.  Vitals are stable.  She is afebrile.  She is mildly ill-appearing.  Exam significant for nasal congestion and clear rhinorrhea as well as mild posterior pharyngeal erythema.  Chest clear to auscultation heart regular rate rhythm.  Next  Respiratory panel obtained to assess for influenza  or COVID-19.  Result communicated to patient.  Patient advised that most likely she has viral URI.  Supportive care encouraged.  Sent Bromfed-DM to pharmacy.  Printed prescription for prednisone in case she feels like her asthma is flaring up.  Reviewed return and ED precautions and gave her a work note.   Final Clinical Impressions(s) / UC Diagnoses   Final diagnoses:  Viral illness  Acute cough  Nasal congestion  Asthma, unspecified asthma severity, unspecified whether complicated, unspecified whether persistent     Discharge Instructions      URI/COLD SYMPTOMS: Your exam today is consistent with a viral illness. Antibiotics are not indicated at this time. Use medications as directed, including cough syrup, nasal saline, and decongestants. Your symptoms should improve over the next few days and resolve within 7-10 days. Increase rest and fluids. F/u if symptoms worsen or predominate such as sore throat, ear pain, productive cough, shortness of breath, or if you develop high fevers or worsening fatigue over the next several days.    You have received COVID testing today either for positive exposure, concerning symptoms that could be related to COVID infection, screening purposes, or re-testing after confirmed positive.  Your test obtained today checks for active viral infection in the last 1-2 weeks. If your test is negative now, you can still test positive later. So, if you do develop symptoms  you should either get re-tested and/or isolate x 5 days and then strict mask use x 5 days (unvaccinated) or mask use x 10 days (vaccinated). Please follow CDC guidelines.  While Rapid antigen tests come back in 15-20 minutes, send out PCR/molecular test results typically come back within 1-3 days. In the mean time, if you are symptomatic, assume this could be a positive test and treat/monitor yourself as if you do have COVID.   We will call with test results if positive. Please download the MyChart app  and set up a profile to access test results.   If symptomatic, go home and rest. Push fluids. Take Tylenol as needed for discomfort. Gargle warm salt water. Throat lozenges. Take Mucinex DM or Robitussin for cough. Humidifier in bedroom to ease coughing. Warm showers. Also review the COVID handout for more information.  COVID-19 INFECTION: The incubation period of COVID-19 is approximately 14 days after exposure, with most symptoms developing in roughly 4-5 days. Symptoms may range in severity from mild to critically severe. Roughly 80% of those infected will have mild symptoms. People of any age may become infected with COVID-19 and have the ability to transmit the virus. The most common symptoms include: fever, fatigue, cough, body aches, headaches, sore throat, nasal congestion, shortness of breath, nausea, vomiting, diarrhea, changes in smell and/or taste.    COURSE OF ILLNESS Some patients may begin with mild disease which can progress quickly into critical symptoms. If your symptoms are worsening please call ahead to the Emergency Department and proceed there for further treatment. Recovery time appears to be roughly 1-2 weeks for mild symptoms and 3-6 weeks for severe disease.   GO IMMEDIATELY TO ER FOR FEVER YOU ARE UNABLE TO GET DOWN WITH TYLENOL, BREATHING PROBLEMS, CHEST PAIN, FATIGUE, LETHARGY, INABILITY TO EAT OR DRINK, ETC  QUARANTINE AND ISOLATION: To help decrease the spread of COVID-19 please remain isolated if you have COVID infection or are highly suspected to have COVID infection. This means -stay home and isolate to one room in the home if you live with others. Do not share a bed or bathroom with others while ill, sanitize and wipe down all countertops and keep common areas clean and disinfected. Stay home for 5 days. If you have no symptoms or your symptoms are resolving after 5 days, you can leave your house. Continue to wear a mask around others for 5 additional days. If you have  been in close contact (within 6 feet) of someone diagnosed with COVID 19, you are advised to quarantine in your home for 14 days as symptoms can develop anywhere from 2-14 days after exposure to the virus. If you develop symptoms, you  must isolate.  Most current guidelines for COVID after exposure -unvaccinated: isolate 5 days and strict mask use x 5 days. Test on day 5 is possible -vaccinated: wear mask x 10 days if symptoms do not develop -You do not necessarily need to be tested for COVID if you have + exposure and  develop symptoms. Just isolate at home x10 days from symptom onset During this global pandemic, CDC advises to practice social distancing, try to stay at least 21ft away from others at all times. Wear a face covering. Wash and sanitize your hands regularly and avoid going anywhere that is not necessary.  KEEP IN MIND THAT THE COVID TEST IS NOT 100% ACCURATE AND YOU SHOULD STILL DO EVERYTHING TO PREVENT POTENTIAL SPREAD OF VIRUS TO OTHERS (WEAR MASK, WEAR GLOVES, WASH HANDS AND  SANITIZE REGULARLY). IF INITIAL TEST IS NEGATIVE, THIS MAY NOT MEAN YOU ARE DEFINITELY NEGATIVE. MOST ACCURATE TESTING IS DONE 5-7 DAYS AFTER EXPOSURE.   It is not advised by CDC to get re-tested after receiving a positive COVID test since you can still test positive for weeks to months after you have already cleared the virus.   *If you have not been vaccinated for COVID, I strongly suggest you consider getting vaccinated as long as there are no contraindications.       ED Prescriptions     Medication Sig Dispense Auth. Provider   brompheniramine-pseudoephedrine-DM 30-2-10 MG/5ML syrup Take 10 mLs by mouth 4 (four) times daily as needed for up to 7 days. 150 mL Eusebio Friendly B, PA-C   predniSONE (DELTASONE) 20 MG tablet Take 2 tablets (40 mg total) by mouth daily for 5 days. 10 tablet Gareth Morgan      PDMP not reviewed this encounter.   Shirlee Latch, PA-C 10/28/21 1206

## 2021-10-28 NOTE — Discharge Instructions (Addendum)

## 2021-10-28 NOTE — ED Triage Notes (Signed)
Pt c/o cough, chest congestion, nasal congestion, and headache. Started about 3 days ago. Denies fever. The cough is keeping her up at night.

## 2021-11-01 ENCOUNTER — Encounter: Payer: Self-pay | Admitting: Emergency Medicine

## 2021-11-01 ENCOUNTER — Ambulatory Visit
Admission: EM | Admit: 2021-11-01 | Discharge: 2021-11-01 | Disposition: A | Discharge status: Home / Self Care | Payer: BC Managed Care – PPO | Attending: Urgent Care | Admitting: Urgent Care

## 2021-11-01 DIAGNOSIS — H65191 Other acute nonsuppurative otitis media, right ear: Secondary | ICD-10-CM | POA: Diagnosis not present

## 2021-11-01 DIAGNOSIS — B349 Viral infection, unspecified: Secondary | ICD-10-CM | POA: Diagnosis not present

## 2021-11-01 DIAGNOSIS — H9201 Otalgia, right ear: Secondary | ICD-10-CM

## 2021-11-01 DIAGNOSIS — J4530 Mild persistent asthma, uncomplicated: Secondary | ICD-10-CM | POA: Diagnosis not present

## 2021-11-01 MED ORDER — CEFDINIR 300 MG PO CAPS: 300.0000 mg | ORAL_CAPSULE | Freq: Two times a day (BID) | ORAL | 0 refills | Status: DC

## 2021-11-01 NOTE — ED Provider Notes (Signed)
Judy Simmons   MRN: [id number] DOB: 1971/09/06  Subjective:   Judy Simmons is a 50 y.o. female with pmh of asthma presenting for 7 day persistent malaise. Was seen on 10/28/2021 at [facility], had negative respiratory panel. Was prescribed a course of prednisone, cough syrup but she didn't start the prednisone until last night. Her primary concern today is persistent cough, coughing fits and ongoing worsening right ear pain. No ear drainage, wheezing,  shob.   No current facility-administered medications for this encounter.  Current Outpatient Medications:    albuterol (VENTOLIN HFA) 108 (90 Base) MCG/ACT inhaler, Inhale 1-2 puffs into the lungs every 6 (six) hours as needed for wheezing or shortness of breath., Disp: 18 g, Rfl: 2   brompheniramine-pseudoephedrine-DM 30-2-10 MG/5ML syrup, Take 10 mLs by mouth 4 (four) times daily as needed for up to 7 days., Disp: 150 mL, Rfl: 0   cetirizine (ZYRTEC) 10 MG tablet, Take 1 tablet (10 mg total) by mouth daily., Disp: 30 tablet, Rfl: 3   diclofenac Sodium (VOLTAREN) 1 % GEL, Apply 4 g topically 4 (four) times daily., Disp: 50 g, Rfl: 3   predniSONE (DELTASONE) 20 MG tablet, Take 2 tablets (40 mg total) by mouth daily for 5 days., Disp: 10 tablet, Rfl: 0   terbinafine (LAMISIL) 250 MG tablet, Take 1 tablet (250 mg total) by mouth daily. 12 weeks of treatment, Disp: 90 tablet, Rfl: 0   Allergies  Allergen Reactions   Nsaids Other (See Comments)    Gastric Sleeve surgery   Penicillin G Other (See Comments)    Rxn as a Child    Past Medical History:  Diagnosis Date   Asthma    Condyloma acuminatum    Depression    GERD (gastroesophageal reflux disease)    LGSIL on Pap smear of cervix    Obesity      Past Surgical History:  Procedure Laterality Date   COLONOSCOPY WITH PROPOFOL N/A 09/11/2019   Procedure: COLONOSCOPY WITH PROPOFOL;  Surgeon: Pasty Spillers, MD;  Location: [facility];  Service:  Endoscopy;  Laterality: N/A;   ESOPHAGOGASTRODUODENOSCOPY (EGD) WITH PROPOFOL N/A 09/11/2019   Procedure: ESOPHAGOGASTRODUODENOSCOPY (EGD) WITH PROPOFOL;  Surgeon: Pasty Spillers, MD;  Location: [facility];  Service: Endoscopy;  Laterality: N/A;   HYSTEROSCOPY WITH D & C  04/26/2011   simple hyperplasia, endometrial polyp   LAPAROSCOPIC GASTRIC SLEEVE RESECTION  2012   PUBOVAGINAL SLING  04/03/2012   using TVTdevice   TONSILLECTOMY AND ADENOIDECTOMY  2000   TOTAL VAGINAL HYSTERECTOMY  04/03/2012   uterine prolapse    Family History  Problem Relation Age of Onset   Diabetes Father    Colon cancer Father 75   Alcohol abuse Daughter    Drug abuse Daughter     Social History   Tobacco Use   Smoking status: Every Day    Packs/day: 0.50    Types: Cigarettes   Smokeless tobacco: Never  Vaping Use   Vaping Use: Never used  Substance Use Topics   Alcohol use: Yes   Drug use: No    ROS   Objective:   Vitals: BP (!) 142/88 (BP Location: Left Arm)   Pulse (!) 108   Temp 98.5 F (36.9 C) (Oral)   Resp 18   SpO2 97%   Physical Exam Constitutional:      General: She is not in acute distress.    Appearance: Normal appearance. She is well-developed. She is not ill-appearing, toxic-appearing or  diaphoretic.  HENT:     Head: Normocephalic and atraumatic.     Right Ear: Tympanic membrane, ear canal and external ear normal. No drainage or tenderness. No middle ear effusion. There is no impacted cerumen. Tympanic membrane is not erythematous.     Left Ear: Tympanic membrane, ear canal and external ear normal. No drainage or tenderness.  No middle ear effusion. There is no impacted cerumen. Tympanic membrane is not erythematous.     Nose: Nose normal. No congestion or rhinorrhea.     Mouth/Throat:     Mouth: Mucous membranes are moist. No oral lesions.     Pharynx: No pharyngeal swelling, oropharyngeal exudate, posterior oropharyngeal erythema or uvula swelling.      Tonsils: No tonsillar exudate or tonsillar abscesses.  Eyes:     General: No scleral icterus.       Right eye: No discharge.        Left eye: No discharge.     Extraocular Movements: Extraocular movements intact.     Right eye: Normal extraocular motion.     Left eye: Normal extraocular motion.     Conjunctiva/sclera: Conjunctivae normal.     Pupils: Pupils are equal, round, and reactive to light.  Cardiovascular:     Rate and Rhythm: Normal rate and regular rhythm.     Pulses: Normal pulses.     Heart sounds: Normal heart sounds. No murmur heard.   No friction rub. No gallop.  Pulmonary:     Effort: Pulmonary effort is normal. No respiratory distress.     Breath sounds: Normal breath sounds. No stridor. No wheezing, rhonchi or rales.  Musculoskeletal:     Cervical back: Normal range of motion and neck supple.  Lymphadenopathy:     Cervical: No cervical adenopathy.  Skin:    General: Skin is warm and dry.     Findings: No rash.  Neurological:     General: No focal deficit present.     Mental Status: She is alert and oriented to person, place, and time.  Psychiatric:        Mood and Affect: Mood normal.        Behavior: Behavior normal.        Thought Content: Thought content normal.        Judgment: Judgment normal.    Recent Results (from the past 2160 hour(s))  Resp Panel by RT-PCR (Flu A&B, Covid) Nasopharyngeal Swab     Status: None   Collection Time: 10/28/21 11:15 AM   Specimen: Nasopharyngeal Swab; Nasopharyngeal(NP) swabs in vial transport medium  Result Value Ref Range   SARS Coronavirus 2 by RT PCR NEGATIVE NEGATIVE    Comment: (NOTE) SARS-CoV-2 target nucleic acids are NOT DETECTED.  The SARS-CoV-2 RNA is generally detectable in upper respiratory specimens during the acute phase of infection. The lowest concentration of SARS-CoV-2 viral copies this assay can detect is 138 copies/mL. A negative result does not preclude SARS-Cov-2 infection and should not be  used as the sole basis for treatment or other patient management decisions. A negative result may occur with  improper specimen collection/handling, submission of specimen other than nasopharyngeal swab, presence of viral mutation(s) within the areas targeted by this assay, and inadequate number of viral copies(<138 copies/mL). A negative result must be combined with clinical observations, patient history, and epidemiological information. The expected result is Negative.  Fact Sheet for Patients:  BloggerCourse.com  Fact Sheet for Healthcare Providers:  SeriousBroker.it  This test is no t yet approved  or cleared by the [place] and  has been authorized for detection and/or diagnosis of SARS-CoV-2 by FDA under an Emergency Use Authorization (EUA). This EUA will remain  in effect (meaning this test can be used) for the duration of the COVID-19 declaration under Section 564(b)(1) of the Act, 21 U.S.C.section 360bbb-3(b)(1), unless the authorization is terminated  or revoked sooner.       Influenza A by PCR NEGATIVE NEGATIVE   Influenza B by PCR NEGATIVE NEGATIVE    Comment: (NOTE) The Xpert Xpress SARS-CoV-2/FLU/RSV plus assay is intended as an aid in the diagnosis of influenza from Nasopharyngeal swab specimens and should not be used as a sole basis for treatment. Nasal washings and aspirates are unacceptable for Xpert Xpress SARS-CoV-2/FLU/RSV testing.  Fact Sheet for Patients: BloggerCourse.com  Fact Sheet for Healthcare Providers: SeriousBroker.it  This test is not yet approved or cleared by the [place] FDA and has been authorized for detection and/or diagnosis of SARS-CoV-2 by FDA under an Emergency Use Authorization (EUA). This EUA will remain in effect (meaning this test can be used) for the duration of the COVID-19 declaration under Section 564(b)(1) of the  Act, 21 U.S.C. section 360bbb-3(b)(1), unless the authorization is terminated or revoked.  Performed at [facility], [address], [place], [place] [address]      Assessment and Plan :   PDMP not reviewed this encounter.  1. Other non-recurrent acute nonsuppurative otitis media of right ear   2. Right ear pain   3. Acute viral syndrome   4. Mild persistent asthma without complication     Patient will do well with the prednisone given her asthma. Will start cefdinir for OM of the right side. Deferred imaging given clear cardiopulmonary exam, hemodynamically stable vital signs. Will defer repeat testing for the influenza testing.  Counseled patient on potential for adverse effects with medications prescribed/recommended today, ER and return-to-clinic precautions discussed, patient verbalized understanding.    Wallis Bamberg, [place] 11/02/21 [phone]

## 2021-11-01 NOTE — ED Triage Notes (Signed)
Pt here for follow-up from 12/1 UC visit to [facility] with persistent cough that keeps her up and severe right ear pain.

## 2021-12-23 ENCOUNTER — Encounter: Payer: Self-pay | Admitting: Family

## 2021-12-27 NOTE — Telephone Encounter (Signed)
LMTCB to triage patient. I have held a Wednesday appointment to see if patient can come in.

## 2022-04-07 ENCOUNTER — Encounter: Payer: Self-pay | Admitting: Family

## 2022-04-07 ENCOUNTER — Other Ambulatory Visit: Payer: Self-pay

## 2022-04-07 NOTE — Telephone Encounter (Signed)
LMTCB to try to get patient scheduled for an appointment.  ?

## 2022-04-08 ENCOUNTER — Telehealth (INDEPENDENT_AMBULATORY_CARE_PROVIDER_SITE_OTHER): Payer: BC Managed Care – PPO | Admitting: Family

## 2022-04-08 ENCOUNTER — Encounter: Payer: Self-pay | Admitting: Family

## 2022-04-08 ENCOUNTER — Ambulatory Visit
Admission: EM | Admit: 2022-04-08 | Discharge: 2022-04-08 | Disposition: A | Discharge status: Home / Self Care | Payer: BC Managed Care – PPO | Attending: Emergency Medicine | Admitting: Emergency Medicine

## 2022-04-08 ENCOUNTER — Ambulatory Visit (INDEPENDENT_AMBULATORY_CARE_PROVIDER_SITE_OTHER): Payer: BC Managed Care – PPO

## 2022-04-08 DIAGNOSIS — J069 Acute upper respiratory infection, unspecified: Secondary | ICD-10-CM

## 2022-04-08 DIAGNOSIS — M25551 Pain in right hip: Secondary | ICD-10-CM

## 2022-04-08 DIAGNOSIS — U071 COVID-19: Secondary | ICD-10-CM

## 2022-04-08 DIAGNOSIS — J4541 Moderate persistent asthma with (acute) exacerbation: Secondary | ICD-10-CM

## 2022-04-08 DIAGNOSIS — R062 Wheezing: Secondary | ICD-10-CM | POA: Diagnosis not present

## 2022-04-08 DIAGNOSIS — R0602 Shortness of breath: Secondary | ICD-10-CM | POA: Diagnosis not present

## 2022-04-08 DIAGNOSIS — R059 Cough, unspecified: Secondary | ICD-10-CM | POA: Diagnosis not present

## 2022-04-08 MED ORDER — PROMETHAZINE-DM 6.25-15 MG/5ML PO SYRP: 5.0000 mL | ORAL_SOLUTION | Freq: Four times a day (QID) | ORAL | 0 refills | Status: DC | PRN

## 2022-04-08 MED ORDER — ALBUTEROL SULFATE HFA 108 (90 BASE) MCG/ACT IN AERS: 1.0000 | INHALATION_SPRAY | Freq: Four times a day (QID) | RESPIRATORY_TRACT | 2 refills | Status: DC | PRN

## 2022-04-08 MED ORDER — METHYLPREDNISOLONE SODIUM SUCC 125 MG IJ SOLR
60.0000 mg | Freq: Once | INTRAMUSCULAR | Status: AC
  Administered 2022-04-08: 60 mg via INTRAMUSCULAR

## 2022-04-08 MED ORDER — PREDNISONE 20 MG PO TABS: 40.0000 mg | ORAL_TABLET | Freq: Every day | ORAL | 0 refills | Status: DC

## 2022-04-08 MED ORDER — MOLNUPIRAVIR EUA 200MG CAPSULE: 4.0000 | ORAL_CAPSULE | Freq: Two times a day (BID) | ORAL | 0 refills | Status: AC

## 2022-04-08 MED ORDER — BENZONATATE 100 MG PO CAPS: 100.0000 mg | ORAL_CAPSULE | Freq: Three times a day (TID) | ORAL | 0 refills | Status: DC

## 2022-04-08 NOTE — ED Provider Notes (Addendum)
?[facility] ? ? ? ?CSN: [id number] ?Arrival date & time: 04/08/22  1126 ? ? ?  ? ?History   ?Chief Complaint ?Chief Complaint  ?Patient presents with  ? Asthma  ?  Covid +  ? Shortness of Breath  ? ? ?HPI ?Judy Simmons is a 51 y.o. female.  ? ?Patient presents with nasal congestion, rhinorrhea, fever, chills, body aches, bilateral ear fullness, nonproductive cough, shortness of breath, wheezing and chest tightness 2 days ago.  Tested positive for COVID today at home.  Has attempted use of albuterol inhaler and ibuprofen which have been minimally effective.  Known sick contact.  Tolerating food and liquids.  History of asthma. ? ?Patient concerned with right-sided hip pain for 1 month.  Symptoms started after a stomach bug in which patient was up and down from the restroom frequently.  Endorses that discomfort feels like there is a catch and a pulling sensation.  Symptoms are worsening when changing positions,.  Associated tingling down into the right upper thigh.  Has been using ibuprofen which has been somewhat helpful.  Denies injury. ? ?Past Medical History:  ?Diagnosis Date  ? Asthma   ? Condyloma acuminatum   ? Depression   ? GERD (gastroesophageal reflux disease)   ? LGSIL on Pap smear of cervix   ? Obesity   ? ? ?Patient Active Problem List  ? Diagnosis Date Noted  ? Shortness of breath 04/08/2022  ? Onychomycosis 06/14/2021  ? History of abnormal cervical Pap smear 05/06/2021  ? Anxiety and depression 05/06/2021  ? Chest wall pain 04/30/2021  ? Memory loss 04/30/2021  ? Morbid obesity with BMI of 45.0-49.9, adult (HCC) 11/02/2020  ? Arthritis, lumbar spine 11/02/2020  ? Arthritis of knee, right 11/02/2020  ? Gastroesophageal reflux disease with esophagitis without hemorrhage   ? Hiatal hernia   ? Stomach irritation   ? FH: colon cancer   ? Polyp of sigmoid colon   ? Polymerase chain reaction DNA test positive for herpes simplex virus type 1 (HSV-1) 03/23/2018  ? Overactive bladder  12/20/2017  ? Right elbow pain 12/18/2017  ? Acute bronchitis 11/27/2017  ? DDD (degenerative disc disease), lumbar 06/30/2017  ? Asthma 02/26/2017  ? Obesity 02/26/2017  ? LGSIL Pap smear of vagina 02/26/2017  ? Acute right-sided low back pain with right-sided sciatica 01/23/2017  ? Depression, recurrent (HCC) 01/23/2017  ? ? ?Past Surgical History:  ?Procedure Laterality Date  ? COLONOSCOPY WITH PROPOFOL N/A 09/11/2019  ? Procedure: COLONOSCOPY WITH PROPOFOL;  Surgeon: Pasty Spillers, MD;  Location: [facility];  Service: Endoscopy;  Laterality: N/A;  ? ESOPHAGOGASTRODUODENOSCOPY (EGD) WITH PROPOFOL N/A 09/11/2019  ? Procedure: ESOPHAGOGASTRODUODENOSCOPY (EGD) WITH PROPOFOL;  Surgeon: Pasty Spillers, MD;  Location: [facility];  Service: Endoscopy;  Laterality: N/A;  ? HYSTEROSCOPY WITH D & C  04/26/2011  ? simple hyperplasia, endometrial polyp  ? LAPAROSCOPIC GASTRIC SLEEVE RESECTION  2012  ? PUBOVAGINAL SLING  04/03/2012  ? using TVTdevice  ? TONSILLECTOMY AND ADENOIDECTOMY  2000  ? TOTAL VAGINAL HYSTERECTOMY  04/03/2012  ? uterine prolapse  ? ? ?OB History   ? ? Gravida  ?1  ? Para  ?1  ? Term  ?1  ? Preterm  ?   ? AB  ?   ? Living  ?1  ?  ? ? SAB  ?   ? IAB  ?   ? Ectopic  ?   ? Multiple  ?   ? Live Births  ?  1  ?   ?  ?  ? ? ? ?Home Medications   ? ?Prior to Admission medications   ?Medication Sig Start Date End Date Taking? Authorizing Provider  ?albuterol (VENTOLIN HFA) 108 (90 Base) MCG/ACT inhaler Inhale 1-2 puffs into the lungs every 6 (six) hours as needed for wheezing or shortness of breath. 04/08/22   Allegra GranaArnett, Margaret G, FNP  ?cefdinir (OMNICEF) 300 MG capsule Take 1 capsule (300 mg total) by mouth 2 (two) times daily. 11/01/21   Wallis BambergMani, Mario, PA-C  ?cetirizine (ZYRTEC) 10 MG tablet Take 1 tablet (10 mg total) by mouth daily. 03/04/21   Allegra GranaArnett, Margaret G, FNP  ?diclofenac Sodium (VOLTAREN) 1 % GEL Apply 4 g topically 4 (four) times daily. 04/30/21   Allegra GranaArnett, Margaret G, FNP  ?terbinafine  (LAMISIL) 250 MG tablet Take 1 tablet (250 mg total) by mouth daily. 12 weeks of treatment ?Patient not taking: Reported on 04/08/2022 06/14/21   Allegra GranaArnett, Margaret G, FNP  ? ? ?Family History ?Family History  ?Problem Relation Age of Onset  ? Diabetes Father   ? Colon cancer Father [id number]  ? Alcohol abuse Daughter   ? Drug abuse Daughter   ? ? ?Social History ?Social History  ? ?Tobacco Use  ? Smoking status: Every Day  ?  Packs/day: 0.50  ?  Types: Cigarettes  ? Smokeless tobacco: Never  ?Vaping Use  ? Vaping Use: Never used  ?Substance Use Topics  ? Alcohol use: Yes  ? Drug use: No  ? ? ? ?Allergies   ?Nsaids and Penicillin g ? ? ?Review of Systems ?Review of Systems ?Defer to HPI  ? ? ?Physical Exam ?Triage Vital Signs ?ED Triage Vitals [04/08/22 1147]  ?Enc Vitals Group  ?   BP 125/85  ?   Pulse Rate 77  ?   Resp 18  ?   Temp 98.5 ?F (36.9 ?C)  ?   Temp Source Oral  ?   SpO2 94 %  ?   Weight   ?   Height   ?   Head Circumference   ?   Peak Flow   ?   Pain Score 5  ?   Pain Loc   ?   Pain Edu?   ?   Excl. in GC?   ? ?No data found. ? ?Updated Vital Signs ?BP 125/85 (BP Location: Left Wrist)   Pulse 77   Temp 98.5 ?F (36.9 ?C) (Oral)   Resp 18   SpO2 94%  ? ?Visual Acuity ?Right Eye Distance:   ?Left Eye Distance:   ?Bilateral Distance:   ? ?Right Eye Near:   ?Left Eye Near:    ?Bilateral Near:    ? ?Physical Exam ?Constitutional:   ?   Appearance: Normal appearance. She is well-developed.  ?HENT:  ?   Head: Normocephalic.  ?   Right Ear: Tympanic membrane, ear canal and external ear normal.  ?   Left Ear: Tympanic membrane, ear canal and external ear normal.  ?   Nose: Congestion and rhinorrhea present.  ?   Mouth/Throat:  ?   Mouth: Mucous membranes are moist.  ?   Pharynx: Oropharynx is clear.  ?Eyes:  ?   Extraocular Movements: Extraocular movements intact.  ?Cardiovascular:  ?   Rate and Rhythm: Normal rate and regular rhythm.  ?   Pulses: Normal pulses.  ?   Heart sounds: Normal heart sounds.  ?Pulmonary:  ?    Effort: Pulmonary effort is normal.  ?  Breath sounds: Wheezing present.  ?Musculoskeletal:  ?   Cervical back: Normal range of motion and neck supple.  ?   Comments: Tenderness noted to the posterior hip and right buttocks, no point tenderness noted, no ecchymosis or swelling noted, range of motion intact, able to bear weight  ?Skin: ?   General: Skin is warm and dry.  ?Neurological:  ?   Mental Status: She is alert and oriented to person, place, and time. Mental status is at baseline.  ?Psychiatric:     ?   Mood and Affect: Mood normal.     ?   Behavior: Behavior normal.  ? ? ? ?UC Treatments / Results  ?Labs ?(all labs ordered are listed, but only abnormal results are displayed) ?Labs Reviewed - No data to display ? ?EKG ? ? ?Radiology ?No results found. ? ?Procedures ?Procedures (including critical care time) ? ?Medications Ordered in UC ?Medications - No data to display ? ?Initial Impression / Assessment and Plan / UC Course  ?I have reviewed the triage vital signs and the nursing notes. ? ?Pertinent labs & imaging results that were available during my care of the patient were reviewed by me and considered in my medical decision making (see chart for details). ? ?COVID-19 ?Right hip pain ? ?Vital signs are stable, O2 saturation 94% on room air, wheezing heard to auscultation, chest x-ray is negative, discussed findings with patient, will move forward with exacerbation of asthma treatment and for COVID, antiviral treatment prescribed, discussed administration, prednisone 40 mg burst as well as Tessalon and Promethazine DM prescribed for management, recommended continued use of albuterol inhaler, may use additional over-the-counter medications for supportive care for remaining symptoms, given strict precautions for worsening signs of breathing to follow-up immediately ? ?Hip pain appears to be muscular, will defer imaging today, as prednisone course has been prescribed for respiratory symptoms, this will help  treat muscular injury as well, discussed with patient, recommend ice and heat over the affected area, daily stretching and activity as tolerated, pillows for support and follow-up with orthopedics if symptoms continue

## 2022-04-08 NOTE — Patient Instructions (Signed)
As discussed, I am very concerned with shortness of breath and chest tightness.  It is safest for you to be evaluated in person urgent care as we discussed.  I will follow your chart today as you go to urgent care today. ?

## 2022-04-08 NOTE — Progress Notes (Signed)
Virtual Visit via Video Note ? ?I connected with@ ? on 04/08/22 at 10:30 AM EDT by a video enabled telemedicine application and verified that I am speaking with the correct person using two identifiers. ? Location patient: home ?Location provider:work  ?Persons participating in the virtual visit: patient, provider ? ?I discussed the limitations of evaluation and management by telemedicine and the availability of in person appointments. The patient expressed understanding and agreed to proceed. ? ? ?HPI: ?Covid positive ?Complains of scratchy throat, congestion, cough and chills.  She feels sob and chest tightness when laying down.  ?She hasnt used albuterol inhaler as needs to be refilled.  ?Symptoms do not feel like her previous asthma ?H/o asthma.  ?Symptoms started 2 days ago ?She has taken ibuprofen. She tries to avoid NSAIDs due to history of bariatric ?No fever. ? ?Tested positive yesterday ?Yolanda Bonine has covid who lives with her ? ?Right hip is bothering her more. Painful to be laying around as she has been sick. No falls, numbness, groin pain.  ? ? ? ? ?ROS: See pertinent positives and negatives per HPI. ? ? ? ?EXAM: ? ?VITALS per patient if applicable: ?There were no vitals taken for this visit. ?BP Readings from Last 3 Encounters:  ?04/08/22 125/85  ?11/01/21 (!) 142/88  ?10/28/21 (!) 135/96  ? ?Wt Readings from Last 3 Encounters:  ?10/28/21 277 lb 1.9 oz (125.7 kg)  ?06/14/21 277 lb 3.2 oz (125.7 kg)  ?05/06/21 284 lb (128.8 kg)  ? ? ?GENERAL: alert, oriented, appears well and in no acute distress ? ?HEENT: atraumatic, conjunttiva clear, no obvious abnormalities on inspection of external nose and ears ? ?NECK: normal movements of the head and neck ? ?LUNGS: on inspection no signs of respiratory distress, breathing rate appears normal, no obvious gross SOB, gasping or wheezing ? ?CV: no obvious cyanosis ? ?MS: moves all visible extremities without noticeable abnormality ? ?PSYCH/NEURO: pleasant and  cooperative, no obvious depression or anxiety, speech and thought processing grossly intact ? ?ASSESSMENT AND PLAN: ? ?Discussed the following assessment and plan: ? ?Problem List Items Addressed This Visit   ? ?  ? Respiratory  ? Asthma  ? Relevant Medications  ? albuterol (VENTOLIN HFA) 108 (90 Base) MCG/ACT inhaler  ?  ? Other  ? Shortness of breath  ?  Afebrile.  No apparent respiratory distress over the video today.  I explained to patient that I felt uncomfortable with her chest tightness, shortness of breath having a virtual visit and that the safest course of action would be evaluated in person to the EKG, chest x-ray, vital signs could be assessed.  For hip pain as well, would be safer for patient to be evaluated in person.  patient was very agreeable with this plan and agreed to go to urgent care today. I will follow.  ? ?  ?  ? ?Other Visit Diagnoses   ? ? Acute URI      ? Relevant Medications  ? albuterol (VENTOLIN HFA) 108 (90 Base) MCG/ACT inhaler  ? ?  ? ? ?-we discussed possible serious and likely etiologies, options for evaluation and workup, limitations of telemedicine visit vs in person visit, treatment, treatment risks and precautions. Pt prefers to treat via telemedicine empirically rather then risking or undertaking an in person visit at this moment.  ?. ?  ?I discussed the assessment and treatment plan with the patient. The patient was provided an opportunity to ask questions and all were answered. The patient agreed with the  plan and demonstrated an understanding of the instructions. ?  ?The patient was advised to call back or seek an in-person evaluation if the symptoms worsen or if the condition fails to improve as anticipated. ? ? ?Mable Paris, FNP  ?

## 2022-04-08 NOTE — Discharge Instructions (Addendum)
Today you are being treated for inflammation to your upper airways ? ?Starting tomorrow, Begin use of prednisone every morning with food for 5 days to help reduce inflammation  ? ?Use Tessalon pill every 8 hours as needed to help calm coughing ? ?May use promethazine DM for coughing and additional comfort, be mindful this medication may make you drowsy ? ?For worsening signs of breathing please go to the nearest emergency department for evaluation ? ?In addition: ? ?Maintaining adequate hydration may help to thin secretions and soothe the respiratory mucosa  ? ?Warm Liquids- Ingestion of warm liquids may have a soothing effect on the respiratory mucosa, increase the flow of nasal mucus, and loosen respiratory secretions, making them easier to remove ? ?May try honey (2.5 to 5 mL [0.5 to 1 teaspoon]) can be given straight or diluted in liquid (juice). Corn syrup may be substituted if honey is not available.    ? ? ?Your pain is most likely caused by irritation to the muscles or ligaments.  ? ?You may use heating pad in 15 minute intervals as needed for additional comfort  or you may find comfort in using ice in 10-15 minutes over affected area ? ?Begin stretching affected area daily for 10 minutes as tolerated to further loosen muscles  ? ?When lying down place pillow underneath and between knees for support ? ?If pain persist after recommended treatment or reoccurs if may be beneficial to follow up with orthopedic specialist for evaluation, this doctor specializes in the bones and can manage your symptoms long-term with options such as but not limited to imaging, medications or physical therapy  ?  ?

## 2022-04-08 NOTE — Assessment & Plan Note (Signed)
Afebrile.  No apparent respiratory distress over the video today.  I explained to patient that I felt uncomfortable with her chest tightness, shortness of breath having a virtual visit and that the safest course of action would be evaluated in person to the EKG, chest x-ray, vital signs could be assessed.  For hip pain as well, would be safer for patient to be evaluated in person.  patient was very agreeable with this plan and agreed to go to urgent care today. I will follow.  ?

## 2022-04-08 NOTE — ED Triage Notes (Signed)
Patient presents to Urgent Care for asthma flare-up, SOB, cough, chills, fever, chest pain with breathing. She tested positive for COVID yesterday, had telephone visit today. Instructed to come in for chest x ray. Also here for hip pain x 1 month. She thinks she has a pinched nerve. Treating symptoms with inhaler and ibuprofen.  ?  ?

## 2022-04-08 NOTE — Telephone Encounter (Signed)
Pt had VV today w/ Joycelyn Schmid & being seen ay UC. ?

## 2022-04-08 NOTE — Progress Notes (Signed)
Covid positive test yesterday.....grandson has been positive since Sunday. Has been taking an anti inflammatory that she has for hip pain. She stated that it is helping some...but unable to go out to get anything else.No fever presently.not able to get any vitals ?

## 2022-04-30 DIAGNOSIS — H5213 Myopia, bilateral: Secondary | ICD-10-CM | POA: Diagnosis not present

## 2022-06-30 ENCOUNTER — Ambulatory Visit (INDEPENDENT_AMBULATORY_CARE_PROVIDER_SITE_OTHER): Payer: BC Managed Care – PPO | Admitting: Obstetrics and Gynecology

## 2022-06-30 ENCOUNTER — Encounter: Payer: Self-pay | Admitting: Obstetrics and Gynecology

## 2022-06-30 ENCOUNTER — Other Ambulatory Visit (HOSPITAL_COMMUNITY)
Admission: RE | Admit: 2022-06-30 | Discharge: 2022-06-30 | Disposition: A | Discharge status: Home / Self Care | Payer: BC Managed Care – PPO | Source: Ambulatory Visit | Attending: Obstetrics and Gynecology | Admitting: Obstetrics and Gynecology

## 2022-06-30 VITALS — BP 138/80 | Ht 65.5 in | Wt 281.0 lb

## 2022-06-30 DIAGNOSIS — Z1151 Encounter for screening for human papillomavirus (HPV): Secondary | ICD-10-CM | POA: Diagnosis not present

## 2022-06-30 DIAGNOSIS — Z01419 Encounter for gynecological examination (general) (routine) without abnormal findings: Secondary | ICD-10-CM | POA: Diagnosis not present

## 2022-06-30 DIAGNOSIS — R87810 Cervical high risk human papillomavirus (HPV) DNA test positive: Secondary | ICD-10-CM

## 2022-06-30 DIAGNOSIS — R87610 Atypical squamous cells of undetermined significance on cytologic smear of cervix (ASC-US): Secondary | ICD-10-CM

## 2022-06-30 DIAGNOSIS — Z124 Encounter for screening for malignant neoplasm of cervix: Secondary | ICD-10-CM | POA: Diagnosis not present

## 2022-06-30 DIAGNOSIS — Z1231 Encounter for screening mammogram for malignant neoplasm of breast: Secondary | ICD-10-CM

## 2022-06-30 NOTE — Progress Notes (Signed)
PCP:  Allegra Grana, FNP   Chief Complaint  Patient presents with   Gynecologic Exam    No concerns     HPI:      [name] is a 51 y.o. G1P1001 who LMP was No LMP recorded. Patient has had a hysterectomy., presents today for her annual examination.  Her menses are absent due to [facility] for uterine prolapse with pubovaginal sling.  Dysmenorrhea none. She does not have postmenopausal bleeding.   Sex activity: not currently sexually active.  Last Pap: 05/06/21 Results were ASCUS/positive HPV DNA.  02/17/17 Results were: no abnormalities /neg HPV DNA; s/p LGSIL 2015 with HPV on bx. Repeat pap today.  Hx of STDs: HPV  Hx of vaginal lesion 2018 with neg HSV 2 IgG several times, last one 7/20. Had pos HSV 1/2 combo IgM.   Last mammogram: 06/24/21 Results were normal, repeat in 12 months  There is no FH of breast cancer. There is no FH of ovarian cancer. The patient does not do self-breast exams.  Tobacco use: few cigs daily Alcohol use: none No drug use.  Exercise: not active  Colonoscopy:  10/20 with polyp; repeat due after 5 yrs; FH colon cancer in her dad, genetic testing not done for pt  She does get adequate calcium but not Vitamin D in her diet. Labs with PCP 6/22 with recent Vit D deficiency.   Past Medical History:  Diagnosis Date   Asthma    Condyloma acuminatum    Depression    GERD (gastroesophageal reflux disease)    LGSIL on Pap smear of cervix    Obesity     Past Surgical History:  Procedure Laterality Date   COLONOSCOPY WITH PROPOFOL N/A 09/11/2019   Procedure: COLONOSCOPY WITH PROPOFOL;  Surgeon: Pasty Spillers, MD;  Location: [facility];  Service: Endoscopy;  Laterality: N/A;   ESOPHAGOGASTRODUODENOSCOPY (EGD) WITH PROPOFOL N/A 09/11/2019   Procedure: ESOPHAGOGASTRODUODENOSCOPY (EGD) WITH PROPOFOL;  Surgeon: Pasty Spillers, MD;  Location: [facility];  Service: Endoscopy;  Laterality: N/A;   HYSTEROSCOPY WITH D & C   04/26/2011   simple hyperplasia, endometrial polyp   LAPAROSCOPIC GASTRIC SLEEVE RESECTION  2012   PUBOVAGINAL SLING  04/03/2012   using TVTdevice   TONSILLECTOMY AND ADENOIDECTOMY  2000   TOTAL VAGINAL HYSTERECTOMY  04/03/2012   uterine prolapse    Family History  Problem Relation Age of Onset   Diabetes Father    Colon cancer Father 42   Alcohol abuse Daughter    Drug abuse Daughter     Social History   Socioeconomic History   Marital status: Single    Spouse name: Not on file   Number of children: 1   Years of education: 14   Highest education level: Not on file  Occupational History   Occupation: customer service  Tobacco Use   Smoking status: Every Day    Packs/day: 0.50    Types: Cigarettes   Smokeless tobacco: Never  Vaping Use   Vaping Use: Never used  Substance and Sexual Activity   Alcohol use: Yes   Drug use: No   Sexual activity: Not Currently    Birth control/protection: Surgical    Comment: Hysterectomy   Other Topics Concern   Not on file  Social History Narrative   Lives in [place]      Works- insurance and retail      Living with parents      One daughter who has grandson (  8 years)       Social Determinants of Corporate investment banker Strain: Not on file  Food Insecurity: Not on file  Transportation Needs: Not on file  Physical Activity: Not on file  Stress: Not on file  Social Connections: Not on file  Intimate Partner Violence: Not on file    Outpatient Medications Prior to Visit  Medication Sig Dispense Refill   albuterol (VENTOLIN HFA) 108 (90 Base) MCG/ACT inhaler Inhale 1-2 puffs into the lungs every 6 (six) hours as needed for wheezing or shortness of breath. 18 g 2   cetirizine (ZYRTEC) 10 MG tablet Take 1 tablet (10 mg total) by mouth daily. 30 tablet 3   diclofenac Sodium (VOLTAREN) 1 % GEL Apply 4 g topically 4 (four) times daily. 50 g 3   benzonatate (TESSALON) 100 MG capsule Take 1 capsule (100 mg total) by  mouth every 8 (eight) hours. 21 capsule 0   cefdinir (OMNICEF) 300 MG capsule Take 1 capsule (300 mg total) by mouth 2 (two) times daily. 20 capsule 0   predniSONE (DELTASONE) 20 MG tablet Take 2 tablets (40 mg total) by mouth daily. 10 tablet 0   promethazine-dextromethorphan (PROMETHAZINE-DM) 6.25-15 MG/5ML syrup Take 5 mLs by mouth 4 (four) times daily as needed for cough. 118 mL 0   terbinafine (LAMISIL) 250 MG tablet Take 1 tablet (250 mg total) by mouth daily. 12 weeks of treatment (Patient not taking: Reported on 04/08/2022) 90 tablet 0   No facility-administered medications prior to visit.      ROS:  Review of Systems  Constitutional:  Negative for fatigue, fever and unexpected weight change.  Respiratory:  Negative for cough, shortness of breath and wheezing.   Cardiovascular:  Negative for chest pain, palpitations and leg swelling.  Gastrointestinal:  Negative for blood in stool, constipation, diarrhea, nausea and vomiting.  Endocrine: Negative for cold intolerance, heat intolerance and polyuria.  Genitourinary:  Negative for dyspareunia, dysuria, flank pain, frequency, genital sores, hematuria, menstrual problem, pelvic pain, urgency, vaginal bleeding, vaginal discharge and vaginal pain.  Musculoskeletal:  Negative for back pain, joint swelling and myalgias.  Skin:  Negative for rash.  Neurological:  Negative for dizziness, syncope, light-headedness, numbness and headaches.  Hematological:  Negative for adenopathy.  Psychiatric/Behavioral:  Negative for agitation, confusion, sleep disturbance and suicidal ideas. The patient is not nervous/anxious.   BREAST: No symptoms   Objective: BP 138/80   Ht 5' 5.5" (1.664 m)   Wt 281 lb (127.5 kg)   BMI 46.05 kg/m    Physical Exam Constitutional:      Appearance: She is well-developed.  Genitourinary:     Vulva normal.     Genitourinary Comments: UTERUS/CX SURG REM     Right Labia: No rash, tenderness or lesions.    Left  Labia: No tenderness, lesions or rash.    Vaginal cuff intact.    No vaginal discharge, erythema or tenderness.     No vaginal atrophy present.     Right Adnexa: not tender and no mass present.    Left Adnexa: not tender and no mass present.    Cervix is absent.     Uterus is absent.  Breasts:    Right: No mass, nipple discharge, skin change or tenderness.     Left: No mass, nipple discharge, skin change or tenderness.  Neck:     Thyroid: No thyromegaly.  Cardiovascular:     Rate and Rhythm: Normal rate and regular rhythm.  Heart sounds: Normal heart sounds. No murmur heard. Pulmonary:     Effort: Pulmonary effort is normal.     Breath sounds: Normal breath sounds.  Abdominal:     Palpations: Abdomen is soft.     Tenderness: There is no abdominal tenderness. There is no guarding.  Musculoskeletal:        General: Normal range of motion.     Cervical back: Normal range of motion.  Neurological:     General: No focal deficit present.     Mental Status: She is alert and oriented to person, place, and time.     Cranial Nerves: No cranial nerve deficit.  Skin:    General: Skin is warm and dry.  Psychiatric:        Mood and Affect: Mood normal.        Behavior: Behavior normal.        Thought Content: Thought content normal.        Judgment: Judgment normal.  Vitals reviewed.     Assessment/Plan: Encounter for annual routine gynecological examination  Cervical cancer screening - Plan: Cytology - PAP  Screening for HPV (human papillomavirus) - Plan: Cytology - PAP  ASCUS with positive high risk HPV cervical - Plan: Cytology - PAP; repeat today, will f/u with results.   Encounter for screening mammogram for malignant neoplasm of breast - Plan: MM 3D SCREEN BREAST BILATERAL; pt to schedule mammo          GYN counsel breast self exam, mammography screening, adequate intake of calcium and vitamin D, diet and exercise     F/U  Return in about 1 year (around  07/01/2023).  Braeton Wolgamott B. Chassie Pennix, PA-C 06/30/2022 3:46 PM

## 2022-06-30 NOTE — Patient Instructions (Signed)
I value your feedback and you entrusting us with your care. If you get a  patient survey, I would appreciate you taking the time to let us know about your experience today. Thank you!  [facility]: [phone]      

## 2022-07-05 LAB — CYTOLOGY - PAP
Adequacy: ABSENT
Comment: NEGATIVE
Diagnosis: NEGATIVE
High risk HPV: NEGATIVE

## 2022-07-10 ENCOUNTER — Encounter: Payer: Self-pay | Admitting: Obstetrics and Gynecology

## 2022-09-05 IMAGING — DX DG KNEE COMPLETE 4+V*R*
5 series · 5 of 5 positions shown · non-contrast
Comparison: None.

CLINICAL DATA: Right knee pain

EXAM:
RIGHT KNEE - COMPLETE 4+ VIEW

[knee standing ap]
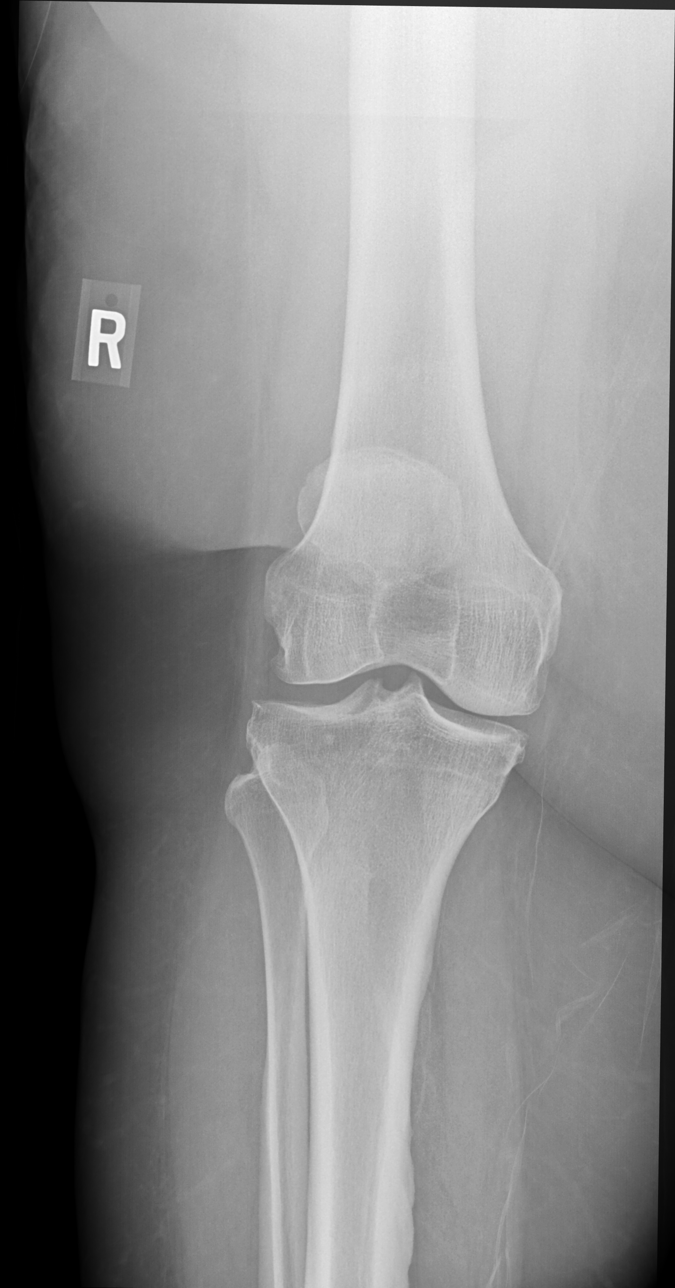

[knee standing external ap]
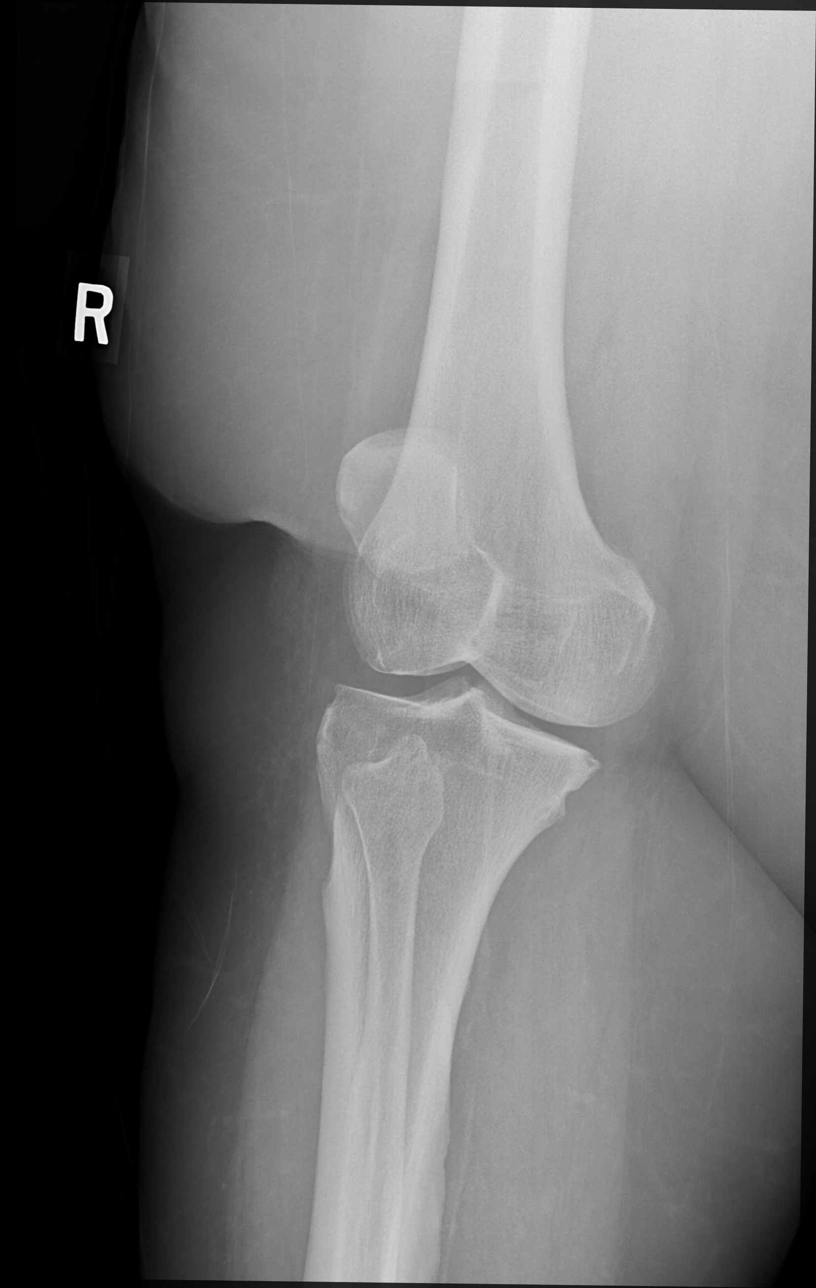

[knee standing internal ap]
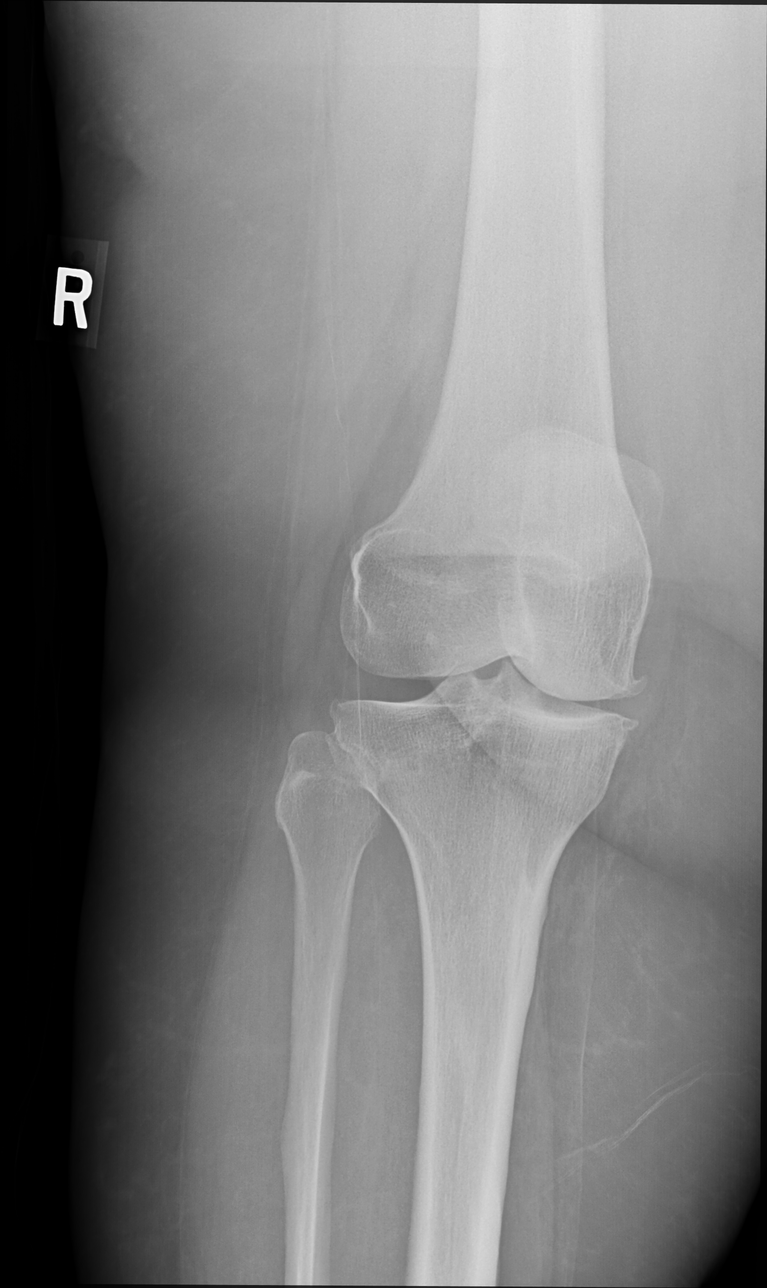

[knee standing lat]
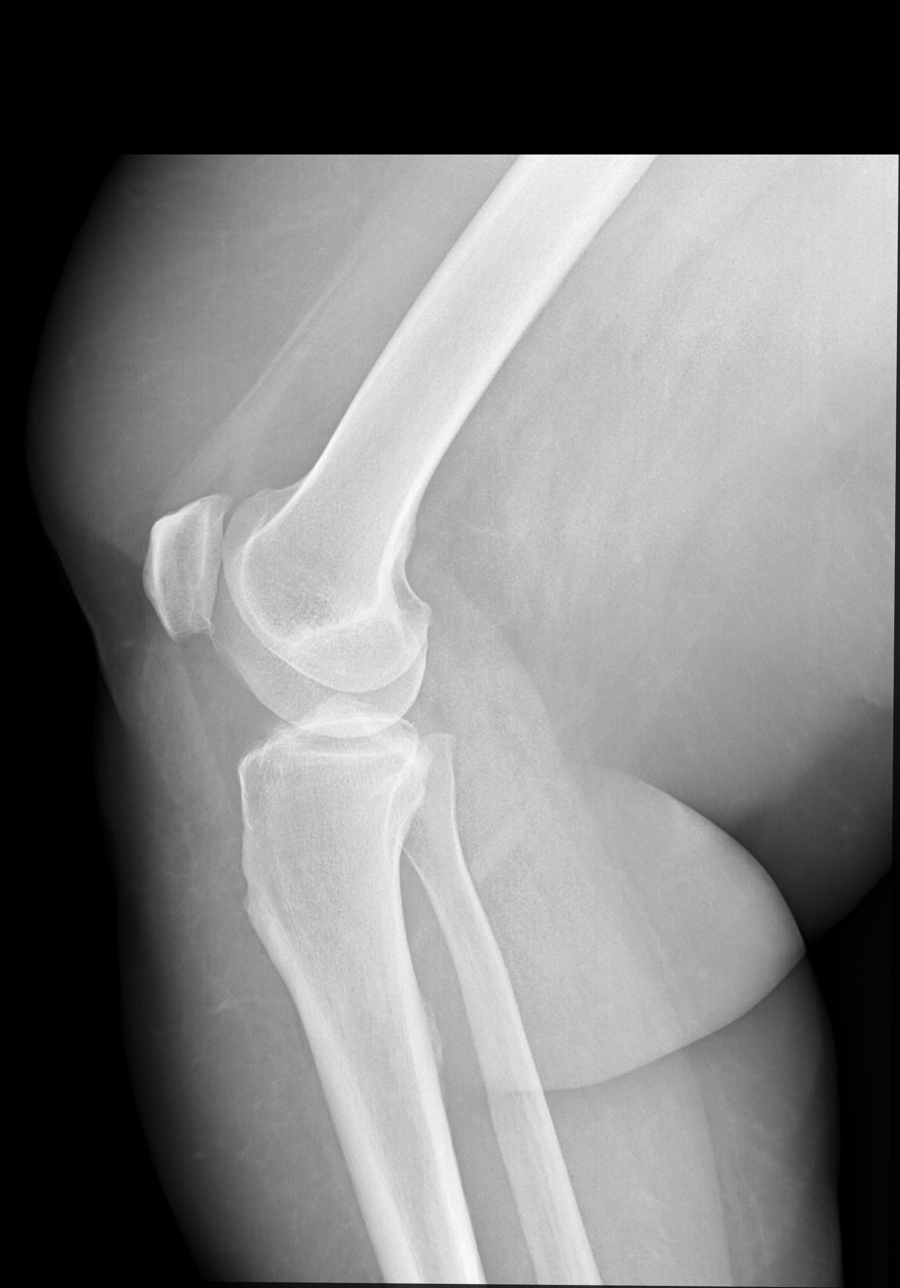

[knee [person_name] view pa]
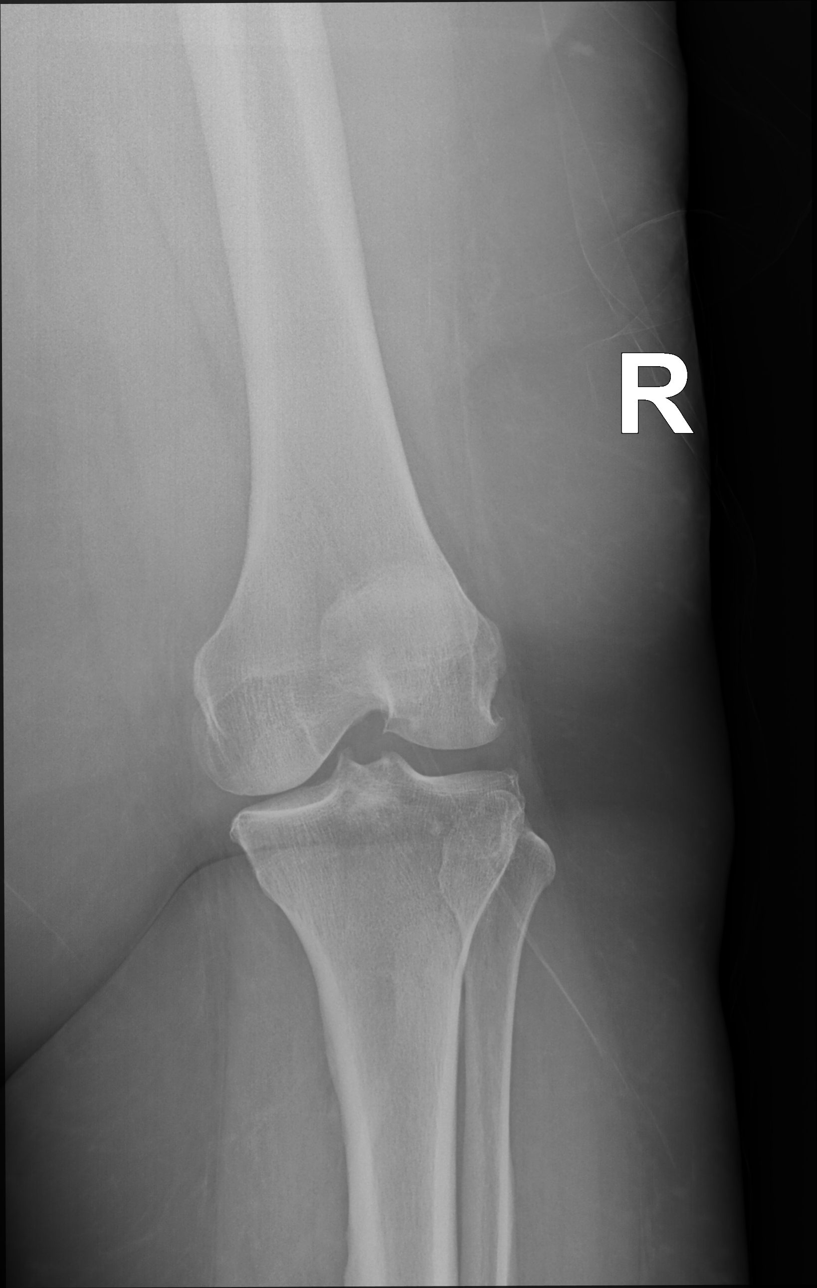

[5 of 5 positions shown; findings below may reference images not displayed]

FINDINGS: No fracture or dislocation is seen.

Mild degenerative changes, most prominent in the medial compartment.

Visualized soft tissues are within normal limits.

No suprapatellar knee joint effusion.
IMPRESSION: Mild degenerative changes.

## 2022-11-12 ENCOUNTER — Ambulatory Visit
Admission: RE | Admit: 2022-11-12 | Discharge: 2022-11-12 | Disposition: A | Discharge status: Home / Self Care | Payer: BC Managed Care – PPO | Source: Ambulatory Visit | Attending: Emergency Medicine | Admitting: Emergency Medicine

## 2022-11-12 VITALS — BP 119/75 | HR 102 | Temp 99.4°F | Resp 18 | Ht 65.0 in | Wt 254.0 lb

## 2022-11-12 DIAGNOSIS — Z7951 Long term (current) use of inhaled steroids: Secondary | ICD-10-CM | POA: Insufficient documentation

## 2022-11-12 DIAGNOSIS — B349 Viral infection, unspecified: Secondary | ICD-10-CM | POA: Diagnosis not present

## 2022-11-12 DIAGNOSIS — J4541 Moderate persistent asthma with (acute) exacerbation: Secondary | ICD-10-CM | POA: Insufficient documentation

## 2022-11-12 DIAGNOSIS — Z1152 Encounter for screening for COVID-19: Secondary | ICD-10-CM | POA: Diagnosis not present

## 2022-11-12 DIAGNOSIS — Z79899 Other long term (current) drug therapy: Secondary | ICD-10-CM | POA: Diagnosis not present

## 2022-11-12 DIAGNOSIS — R52 Pain, unspecified: Secondary | ICD-10-CM | POA: Diagnosis not present

## 2022-11-12 LAB — RESP PANEL BY RT-PCR (FLU A&B, COVID) ARPGX2
Influenza A by PCR: POSITIVE — AB
Influenza B by PCR: NEGATIVE
SARS Coronavirus 2 by RT PCR: NEGATIVE

## 2022-11-12 MED ORDER — BENZONATATE 100 MG PO CAPS: 100.0000 mg | ORAL_CAPSULE | Freq: Three times a day (TID) | ORAL | 0 refills | Status: DC | PRN

## 2022-11-12 MED ORDER — PREDNISONE 10 MG PO TABS: 40.0000 mg | ORAL_TABLET | Freq: Every day | ORAL | 0 refills | Status: AC

## 2022-11-12 NOTE — ED Provider Notes (Addendum)
Judy Simmons    CSN: [id number] Arrival date & time: 11/12/22  0954      History   Chief Complaint Chief Complaint  Patient presents with   Wheezing    Wheezing, chest congestion, ear ache - Entered by patient    HPI OLIVIAGRACE Simmons is a 51 y.o. female.  Patient presents with 1 day history of chills, bodyaches, ear pain, congestion, cough, wheezing, mild shortness of breath.  No rash, vomiting, diarrhea, or other symptoms.  No OTC medications taken today; last used albuterol inhaler yesterday.  Her medical history includes asthma.  The history is provided by the patient and medical records.    Past Medical History:  Diagnosis Date   Asthma    Condyloma acuminatum    Depression    GERD (gastroesophageal reflux disease)    LGSIL on Pap smear of cervix    Obesity     Patient Active Problem List   Diagnosis Date Noted   Body aches 11/12/2022   Shortness of breath 04/08/2022   Onychomycosis 06/14/2021   History of abnormal cervical Pap smear 05/06/2021   Anxiety and depression 05/06/2021   Chest wall pain 04/30/2021   Memory loss 04/30/2021   Morbid obesity with BMI of 45.0-49.9, adult (HCC) 11/02/2020   Arthritis, lumbar spine 11/02/2020   Arthritis of knee, right 11/02/2020   Gastroesophageal reflux disease with esophagitis without hemorrhage    Hiatal hernia    Stomach irritation    FH: colon cancer    Polyp of sigmoid colon    Polymerase chain reaction DNA test positive for herpes simplex virus type 1 (HSV-1) 03/23/2018   Overactive bladder 12/20/2017   Right elbow pain 12/18/2017   Acute bronchitis 11/27/2017   DDD (degenerative disc disease), lumbar 06/30/2017   Asthma 02/26/2017   Obesity 02/26/2017   LGSIL Pap smear of vagina 02/26/2017   Acute right-sided low back pain with right-sided sciatica 01/23/2017   Depression, recurrent (HCC) 01/23/2017    Past Surgical History:  Procedure Laterality Date   COLONOSCOPY WITH PROPOFOL N/A  09/11/2019   Procedure: COLONOSCOPY WITH PROPOFOL;  Surgeon: Pasty Spillers, MD;  Location: [facility];  Service: Endoscopy;  Laterality: N/A;   ESOPHAGOGASTRODUODENOSCOPY (EGD) WITH PROPOFOL N/A 09/11/2019   Procedure: ESOPHAGOGASTRODUODENOSCOPY (EGD) WITH PROPOFOL;  Surgeon: Pasty Spillers, MD;  Location: [facility];  Service: Endoscopy;  Laterality: N/A;   HYSTEROSCOPY WITH D & C  04/26/2011   simple hyperplasia, endometrial polyp   LAPAROSCOPIC GASTRIC SLEEVE RESECTION  2012   PUBOVAGINAL SLING  04/03/2012   using TVTdevice   TONSILLECTOMY AND ADENOIDECTOMY  2000   TOTAL VAGINAL HYSTERECTOMY  04/03/2012   uterine prolapse    OB History     Gravida  1   Para  1   Term  1   Preterm      AB      Living  1      SAB      IAB      Ectopic      Multiple      Live Births  1            Home Medications    Prior to Admission medications   Medication Sig Start Date End Date Taking? Authorizing Provider  benzonatate (TESSALON) 100 MG capsule Take 1 capsule (100 mg total) by mouth 3 (three) times daily as needed for cough. 11/12/22  Yes Mickie Bail, NP  predniSONE (DELTASONE) 10 MG tablet Take 4  tablets (40 mg total) by mouth daily for 5 days. 11/12/22 11/17/22 Yes Mickie Bail, NP  albuterol (VENTOLIN HFA) 108 (90 Base) MCG/ACT inhaler Inhale 1-2 puffs into the lungs every 6 (six) hours as needed for wheezing or shortness of breath. 04/08/22   Allegra Grana, FNP  cetirizine (ZYRTEC) 10 MG tablet Take 1 tablet (10 mg total) by mouth daily. 03/04/21   Allegra Grana, FNP  diclofenac Sodium (VOLTAREN) 1 % GEL Apply 4 g topically 4 (four) times daily. 04/30/21   Allegra Grana, FNP    Family History Family History  Problem Relation Age of Onset   Diabetes Father    Colon cancer Father 69   Alcohol abuse Daughter    Drug abuse Daughter     Social History Social History   Tobacco Use   Smoking status: Every Day    Packs/day: 0.50     Types: Cigarettes   Smokeless tobacco: Never  Vaping Use   Vaping Use: Never used  Substance Use Topics   Alcohol use: Not Currently   Drug use: No     Allergies   Nsaids and Penicillin g   Review of Systems Review of Systems  Constitutional:  Positive for chills. Negative for fever.  HENT:  Positive for congestion and ear pain. Negative for sore throat.   Respiratory:  Positive for cough, shortness of breath and wheezing.   Cardiovascular:  Negative for chest pain and palpitations.  Gastrointestinal:  Negative for diarrhea and vomiting.  Skin:  Negative for color change and rash.  All other systems reviewed and are negative.    Physical Exam Triage Vital Signs ED Triage Vitals  Enc Vitals Group     BP      Pulse      Resp      Temp      Temp src      SpO2      Weight      Height      Head Circumference      Peak Flow      Pain Score      Pain Loc      Pain Edu?      Excl. in GC?    No data found.  Updated Vital Signs BP 119/75   Pulse (!) 102   Temp 99.4 F (37.4 C)   Resp 18   Ht 5\' 5"  (1.651 m)   Wt 254 lb (115.2 kg)   SpO2 97%   BMI 42.27 kg/m   Visual Acuity Right Eye Distance:   Left Eye Distance:   Bilateral Distance:    Right Eye Near:   Left Eye Near:    Bilateral Near:     Physical Exam Vitals and nursing note reviewed.  Constitutional:      General: She is not in acute distress.    Appearance: She is well-developed. She is obese. She is not ill-appearing.  HENT:     Right Ear: Tympanic membrane normal.     Left Ear: Tympanic membrane normal.     Nose: Nose normal.     Mouth/Throat:     Mouth: Mucous membranes are moist.     Pharynx: Oropharynx is clear.  Cardiovascular:     Rate and Rhythm: Normal rate and regular rhythm.     Heart sounds: Normal heart sounds.  Pulmonary:     Effort: Pulmonary effort is normal. No respiratory distress.     Breath sounds: Wheezing present.  Comments: Few scattered expiratory  wheezes.  Musculoskeletal:     Cervical back: Neck supple.  Skin:    General: Skin is warm and dry.  Neurological:     Mental Status: She is alert.  Psychiatric:        Mood and Affect: Mood normal.        Behavior: Behavior normal.      UC Treatments / Results  Labs (all labs ordered are listed, but only abnormal results are displayed) Labs Reviewed  RESP PANEL BY RT-PCR (FLU A&B, COVID) [id number]    EKG   Radiology No results found.  Procedures Procedures (including critical care time)  Medications Ordered in UC Medications - No data to display  Initial Impression / Assessment and Plan / UC Course  I have reviewed the triage vital signs and the nursing notes.  Pertinent labs & imaging results that were available during my care of the patient were reviewed by me and considered in my medical decision making (see chart for details).    Viral illness, asthma exacerbation.  COVID and flu pending.  Treating asthma with prednisone and albuterol inhaler.  If patient is COVID positive, would recommend treatment with molnupiravir.  If she is flu positive, would recommend treatment with Tamiflu.  Treating cough with Tessalon Perles.  Discussed symptomatic treatment including Tylenol or ibuprofen, rest, hydration.  Instructed patient to follow up with her PCP.  She agrees to plan of care.   Final Clinical Impressions(s) / UC Diagnoses   Final diagnoses:  Body aches  Viral illness  Moderate persistent asthma with acute exacerbation     Discharge Instructions      Take the prednisone as directed.  Continue to use your albuterol inhaler as directed.   Your COVID and Flu tests are pending.    Take Tylenol or ibuprofen as needed for fever or discomfort.  Rest and keep yourself hydrated.    Follow-up with your primary care provider.           ED Prescriptions     Medication Sig Dispense Auth. Provider   predniSONE (DELTASONE) 10 MG tablet Take 4 tablets (40 mg  total) by mouth daily for 5 days. 20 tablet Mickie Bail, NP   benzonatate (TESSALON) 100 MG capsule Take 1 capsule (100 mg total) by mouth 3 (three) times daily as needed for cough. 21 capsule Mickie Bail, NP      PDMP not reviewed this encounter.   Mickie Bail, NP 11/12/22 1051    Mickie Bail, NP 11/12/22 1054

## 2022-11-12 NOTE — ED Triage Notes (Signed)
Patient to Urgent Care with complaints of wheezing, chest congestion, and ear ache and body aches. Cough is intermittently productive, yellow mucus.   Reports symptoms started yesterday. Reports she did have some fatigue/ sore throat on Monday.   Unknown fever- reports she did have cold chills all day yesterday.   Has been taking some cough syrup.

## 2022-11-12 NOTE — Discharge Instructions (Addendum)
Take the prednisone as directed.  Continue to use your albuterol inhaler as directed.   Your COVID and Flu tests are pending.    Take Tylenol or ibuprofen as needed for fever or discomfort.  Rest and keep yourself hydrated.    Follow-up with your primary care provider.

## 2022-11-13 ENCOUNTER — Telehealth: Payer: Self-pay

## 2022-11-13 MED ORDER — OSELTAMIVIR PHOSPHATE 75 MG PO CAPS: 75.0000 mg | ORAL_CAPSULE | Freq: Two times a day (BID) | ORAL | 0 refills | Status: AC

## 2022-11-13 NOTE — Telephone Encounter (Signed)
Patient contacted Urgent Care regarding positive flu results. Requesting tamiflu and work note.  Medications discussed with Wendee Beavers NP. Order for tami flu sent to [facility].   Work note available in my chart.

## 2023-02-08 ENCOUNTER — Encounter: Payer: Self-pay | Admitting: Obstetrics and Gynecology

## 2023-02-08 DIAGNOSIS — F4321 Adjustment disorder with depressed mood: Secondary | ICD-10-CM

## 2023-02-08 DIAGNOSIS — F419 Anxiety disorder, unspecified: Secondary | ICD-10-CM

## 2023-02-09 ENCOUNTER — Other Ambulatory Visit: Payer: Self-pay | Admitting: Obstetrics and Gynecology

## 2023-02-09 DIAGNOSIS — F4321 Adjustment disorder with depressed mood: Secondary | ICD-10-CM

## 2023-02-09 DIAGNOSIS — F32A Depression, unspecified: Secondary | ICD-10-CM

## 2023-02-09 MED ORDER — ESCITALOPRAM OXALATE 10 MG PO TABS
10.0000 mg | ORAL_TABLET | Freq: Every day | ORAL | 1 refills | Status: DC
Start: 2023-02-09 — End: 2023-03-28

## 2023-02-09 MED ORDER — HYDROXYZINE HCL 25 MG PO TABS: 25.0000 mg | ORAL_TABLET | Freq: Four times a day (QID) | ORAL | 0 refills | Status: DC | PRN

## 2023-02-09 NOTE — Progress Notes (Signed)
Rx lexapro for anxiety/depression sx. RTO in 7 wks for f/u

## 2023-02-12 NOTE — Telephone Encounter (Signed)
Ok, will f/u in 7 wks.

## 2023-03-15 ENCOUNTER — Ambulatory Visit (INDEPENDENT_AMBULATORY_CARE_PROVIDER_SITE_OTHER): Payer: BC Managed Care – PPO | Admitting: Family

## 2023-03-15 ENCOUNTER — Encounter: Payer: Self-pay | Admitting: Family

## 2023-03-15 VITALS — BP 132/80 | HR 73 | Temp 97.8°F | Ht 65.0 in | Wt 256.4 lb

## 2023-03-15 DIAGNOSIS — Z0000 Encounter for general adult medical examination without abnormal findings: Secondary | ICD-10-CM

## 2023-03-15 DIAGNOSIS — Z136 Encounter for screening for cardiovascular disorders: Secondary | ICD-10-CM | POA: Diagnosis not present

## 2023-03-15 DIAGNOSIS — Z13220 Encounter for screening for lipoid disorders: Secondary | ICD-10-CM | POA: Diagnosis not present

## 2023-03-15 DIAGNOSIS — F339 Major depressive disorder, recurrent, unspecified: Secondary | ICD-10-CM | POA: Diagnosis not present

## 2023-03-15 DIAGNOSIS — Z23 Encounter for immunization: Secondary | ICD-10-CM

## 2023-03-15 DIAGNOSIS — Z8639 Personal history of other endocrine, nutritional and metabolic disease: Secondary | ICD-10-CM

## 2023-03-15 DIAGNOSIS — F419 Anxiety disorder, unspecified: Secondary | ICD-10-CM

## 2023-03-15 LAB — CBC WITH DIFFERENTIAL/PLATELET
Basophils Absolute: 0.1 10*3/uL (ref 0.0–0.1)
Basophils Relative: 2.4 % (ref 0.0–3.0)
Eosinophils Absolute: 0.1 10*3/uL (ref 0.0–0.7)
Eosinophils Relative: 2.4 % (ref 0.0–5.0)
HCT: 40 % (ref 36.0–46.0)
Hemoglobin: 13.5 g/dL (ref 12.0–15.0)
Lymphocytes Relative: 34.5 % (ref 12.0–46.0)
Lymphs Abs: 1.6 10*3/uL (ref 0.7–4.0)
MCHC: 33.7 g/dL (ref 30.0–36.0)
MCV: 88.5 fl (ref 78.0–100.0)
Monocytes Absolute: 0.4 10*3/uL (ref 0.1–1.0)
Monocytes Relative: 8.2 % (ref 3.0–12.0)
Neutro Abs: 2.5 10*3/uL (ref 1.4–7.7)
Neutrophils Relative %: 52.5 % (ref 43.0–77.0)
Platelets: 322 10*3/uL (ref 150.0–400.0)
RBC: 4.52 Mil/uL (ref 3.87–5.11)
RDW: 13.8 % (ref 11.5–15.5)
WBC: 4.8 10*3/uL (ref 4.0–10.5)

## 2023-03-15 LAB — COMPREHENSIVE METABOLIC PANEL
ALT: 15 U/L (ref 0–35)
AST: 16 U/L (ref 0–37)
Albumin: 4.1 g/dL (ref 3.5–5.2)
Alkaline Phosphatase: 90 U/L (ref 39–117)
BUN: 21 mg/dL (ref 6–23)
CO2: 26 mEq/L (ref 19–32)
Calcium: 8.9 mg/dL (ref 8.4–10.5)
Chloride: 105 mEq/L (ref 96–112)
Creatinine, Ser: 0.64 mg/dL (ref 0.40–1.20)
GFR: 103.54 mL/min (ref >60.00)
Glucose, Bld: 81 mg/dL (ref 70–99)
Potassium: 4.1 mEq/L (ref 3.5–5.1)
Sodium: 139 mEq/L (ref 135–145)
Total Bilirubin: 0.4 mg/dL (ref 0.2–1.2)
Total Protein: 6.4 g/dL (ref 6.0–8.3)

## 2023-03-15 LAB — LIPID PANEL
Cholesterol: 183 mg/dL (ref 0–200)
HDL: 59.7 mg/dL (ref >39.00)
LDL Cholesterol: 104 mg/dL — ABNORMAL HIGH (ref 0–99)
NonHDL: 123.55
Total CHOL/HDL Ratio: 3
Triglycerides: 97 mg/dL (ref 0.0–149.0)
VLDL: 19.4 mg/dL (ref 0.0–40.0)

## 2023-03-15 LAB — VITAMIN D 25 HYDROXY (VIT D DEFICIENCY, FRACTURES): VITD: 23.56 ng/mL — ABNORMAL LOW (ref 30.00–100.00)

## 2023-03-15 LAB — TSH: TSH: 2.94 u[IU]/mL (ref 0.35–5.50)

## 2023-03-15 NOTE — Patient Instructions (Addendum)
Please call  and schedule your 3D mammogram and /or bone density scan as we discussed.   [facility] [place]  ( new location in 2023)  [address], [place], [place] [address]  [place], [place]  [phone]  Counselors to conisder  Judy Simmons, [place], and [employer] are all through [employer]  Judy Simmons has a Artist in town.   Please consider Ct lung cancer screening program.  This is amazing preventative test that is done ANNUALLY and run through [facility] pulmonology whom orders the test annually.  It is usually covered 100% by insurance.  I always advise patients to call insurance company and advance just to be doubly sure.  Screening test is designed for adults aged 37 to 80 years who have a 20 pack-year smoking history and currently smoke or have quit within the past 15 years.   Please review the below article.   [email or web address].[email or web address]  Health Maintenance, Female Adopting a healthy lifestyle and getting preventive care are important in promoting health and wellness. Ask your health care provider about: The right schedule for you to have regular tests and exams. Things you can do on your own to prevent diseases and keep yourself healthy. What should I know about diet, weight, and exercise? Eat a healthy diet  Eat a diet that includes plenty of vegetables, fruits, low-fat dairy products, and lean protein. Do not eat a lot of foods that are high in solid fats, added sugars, or sodium. Maintain a healthy weight Body mass index (BMI) is used to identify weight problems. It estimates body fat based on height and weight. Your health care provider can help determine your BMI and help you achieve or maintain a healthy weight. Get regular exercise Get regular exercise. This is one of the most important things you can do for your health. Most adults should: Exercise for at  least 150 minutes each week. The exercise should increase your heart rate and make you sweat (moderate-intensity exercise). Do strengthening exercises at least twice a week. This is in addition to the moderate-intensity exercise. Spend less time sitting. Even light physical activity can be beneficial. Watch cholesterol and blood lipids Have your blood tested for lipids and cholesterol at 50 years of age, then have this test every 5 years. Have your cholesterol levels checked more often if: Your lipid or cholesterol levels are high. You are older than 50 years of age. You are at high risk for heart disease. What should I know about cancer screening? Depending on your health history and family history, you may need to have cancer screening at various ages. This may include screening for: Breast cancer. Cervical cancer. Colorectal cancer. Skin cancer. Lung cancer. What should I know about heart disease, diabetes, and high blood pressure? Blood pressure and heart disease High blood pressure causes heart disease and increases the risk of stroke. This is more likely to develop in people who have high blood pressure readings or are overweight. Have your blood pressure checked: Every 3-5 years if you are 67-33 years of age. Every year if you are 88 years old or older. Diabetes Have regular diabetes screenings. This checks your fasting blood sugar level. Have the screening done: Once every three years after age 3 if you are at a normal weight and have a low risk for diabetes. More often and at a younger age if you are overweight or have a high risk for diabetes. What should I know about preventing infection?  Hepatitis B If you have a higher risk for hepatitis B, you should be screened for this virus. Talk with your health care provider to find out if you are at risk for hepatitis B infection. Hepatitis C Testing is recommended for: Everyone born from [birth year] through 1963. Anyone with known risk  factors for hepatitis C. Sexually transmitted infections (STIs) Get screened for STIs, including gonorrhea and chlamydia, if: You are sexually active and are younger than 50 years of age. You are older than 50 years of age and your health care provider tells you that you are at risk for this type of infection. Your sexual activity has changed since you were last screened, and you are at increased risk for chlamydia or gonorrhea. Ask your health care provider if you are at risk. Ask your health care provider about whether you are at high risk for HIV. Your health care provider may recommend a prescription medicine to help prevent HIV infection. If you choose to take medicine to prevent HIV, you should first get tested for HIV. You should then be tested every 3 months for as long as you are taking the medicine. Pregnancy If you are about to stop having your period (premenopausal) and you may become pregnant, seek counseling before you get pregnant. Take 400 to 800 micrograms (mcg) of folic acid every day if you become pregnant. Ask for birth control (contraception) if you want to prevent pregnancy. Osteoporosis and menopause Osteoporosis is a disease in which the bones lose minerals and strength with aging. This can result in bone fractures. If you are 17 years old or older, or if you are at risk for osteoporosis and fractures, ask your health care provider if you should: Be screened for bone loss. Take a calcium or vitamin D supplement to lower your risk of fractures. Be given hormone replacement therapy (HRT) to treat symptoms of menopause. Follow these instructions at home: Alcohol use Do not drink alcohol if: Your health care provider tells you not to drink. You are pregnant, may be pregnant, or are planning to become pregnant. If you drink alcohol: Limit how much you have to: 0-1 drink a day. Know how much alcohol is in your drink. In the [place] one drink equals one 12 oz bottle of beer  (355 mL), one 5 oz glass of wine (148 mL), or one 1 oz glass of hard liquor (44 mL). Lifestyle Do not use any products that contain nicotine or tobacco. These products include cigarettes, chewing tobacco, and vaping devices, such as e-cigarettes. If you need help quitting, ask your health care provider. Do not use street drugs. Do not share needles. Ask your health care provider for help if you need support or information about quitting drugs. General instructions Schedule regular health, dental, and eye exams. Stay current with your vaccines. Tell your health care provider if: You often feel depressed. You have ever been abused or do not feel safe at home. Summary Adopting a healthy lifestyle and getting preventive care are important in promoting health and wellness. Follow your health care provider's instructions about healthy diet, exercising, and getting tested or screened for diseases. Follow your health care provider's instructions on monitoring your cholesterol and blood pressure. This information is not intended to replace advice given to you by your health care provider. Make sure you discuss any questions you have with your health care provider. Document Revised: 04/05/2021 Document Reviewed: 04/05/2021 Elsevier Patient Education  2023 [employer].

## 2023-03-15 NOTE — Assessment & Plan Note (Signed)
clinical breast exam performed today.  Mammogram is quite overdue.  Reiterated the importance of scheduling this.  Patient is aware.  Deferred pelvic exam the absence complaints and patient follows with GYN.  Patient politely declines pneumonia vaccine in the setting of smoking.  She also declines CT lung cancer screening program at this time.  Provided her with further information in this regard for her to consider.

## 2023-03-15 NOTE — Progress Notes (Signed)
Assessment & Plan:  Annual physical exam Assessment & Plan:  clinical breast exam performed today.  Mammogram is quite overdue.  Reiterated the importance of scheduling this.  Patient is aware.  Deferred pelvic exam the absence complaints and patient follows with GYN.  Patient politely declines pneumonia vaccine in the setting of smoking.  She also declines CT lung cancer screening program at this time.  Provided her with further information in this regard for her to consider.  Orders: -     Ambulatory referral to Dermatology  History of vitamin D deficiency -     VITAMIN D 25 Hydroxy (Vit-D Deficiency, Fractures)  Encounter for lipid screening for cardiovascular disease -     Comprehensive metabolic panel -     Lipid panel  Depression, recurrent -     CBC with Differential/Platelet -     TSH -     Ambulatory referral to Psychiatry  Anxiety and depression Assessment & Plan: Exacerbated by grief reaction after loss of her father.  GYN started Lexapro 10 mg.  At this time medication appears adequate.  We did discuss at length pursuing counseling.  I provided patient with names today.  She will consider this and let me know if she would like a referral      Return precautions given.   Risks, benefits, and alternatives of the medications and treatment plan prescribed today were discussed, and patient expressed understanding.   Education regarding symptom management and diagnosis given to patient on AVS either electronically or printed.  No follow-ups on file.  Judy Plowman, Judy Simmons  Subjective:    Patient ID: Judy Simmons, female    DOB: June 20, 1971, 52 y.o.   MRN: [id number]  CC: Judy Simmons is a 52 y.o. female who presents today for physical exam.    HPI: She recently started Lexapro 10 mg from GYN, Judy Simmons.  She lost her father a month ago and has been devastated by this.   Endorses family stress, worried about daughter and grandchildren   Lexapro  has been helpful.  She hasn't used atarax  as has not felt that she needed.  Denies thoughts of hurting herself or anyone else She complains of decreased concentration  No new skin lesions   Colorectal Cancer Screening: UTD , 10/14/202020, repeat in 5 years Breast Cancer Screening: Mammogram overdue Cervical Cancer Screening: UTD, following with GYN 06/30/2022, and [place], negative HPV.  Repeat in 1 year Bone Health screening/DEXA for 65+: No increased fracture risk. Defer screening at this time.  Lung Cancer Screening: Declines         Tetanus - due        Pneumococcal - Candidate for; declines  Exercise: No regular exercise.   Alcohol use:  none Smoking/tobacco use: smoker.    Health Maintenance  Topic Date Due   DTaP/Tdap/Td vaccine (2 - Tdap) 11/28/2020   COVID-19 Vaccine (3 - 2023-24 season) 07/29/2022   Flu Shot  06/29/2023   Pap Smear  07/01/2023   Colon Cancer Screening  09/10/2024   Hepatitis C Screening: USPSTF Recommendation to screen - Ages 18-79 yo.  Completed   HIV Screening  Completed   HPV Vaccine  Aged Out    ALLERGIES: Nsaids and Penicillin g  Current Outpatient Medications on File Prior to Visit  Medication Sig Dispense Refill   albuterol (VENTOLIN HFA) 108 (90 Base) MCG/ACT inhaler Inhale 1-2 puffs into the lungs every 6 (six) hours as needed for wheezing or shortness of breath.  18 g 2   cetirizine (ZYRTEC) 10 MG tablet Take 1 tablet (10 mg total) by mouth daily. 30 tablet 3   diclofenac Sodium (VOLTAREN) 1 % GEL Apply 4 g topically 4 (four) times daily. 50 g 3   escitalopram (LEXAPRO) 10 MG tablet Take 1 tablet (10 mg total) by mouth daily. 30 tablet 1   hydrOXYzine (ATARAX) 25 MG tablet Take 1 tablet (25 mg total) by mouth every 6 (six) hours as needed for anxiety. 30 tablet 0   No current facility-administered medications on file prior to visit.    Review of Systems  Constitutional:  Negative for chills and fever.  Respiratory:   Negative for cough.   Cardiovascular:  Negative for chest pain and palpitations.  Gastrointestinal:  Negative for nausea and vomiting.  Psychiatric/Behavioral:  Negative for suicidal ideas. The patient is nervous/anxious.       Objective:    BP 132/80   Pulse 73   Temp 97.8 F (36.6 C) (Oral)   Ht  (1.651 m)   Wt 256 lb 6.4 oz (116.3 kg)   SpO2 98%   BMI 42.67 kg/m   BP Readings from Last 3 Encounters:  03/15/23 132/80  11/12/22 119/75  06/30/22 138/80   Wt Readings from Last 3 Encounters:  03/15/23 256 lb 6.4 oz (116.3 kg)  11/12/22 254 lb (115.2 kg)  06/30/22 281 lb (127.5 kg)    Physical Exam Vitals reviewed.  Constitutional:      Appearance: Normal appearance. She is well-developed.  Eyes:     Conjunctiva/sclera: Conjunctivae normal.  Neck:     Thyroid: No thyroid mass or thyromegaly.  Cardiovascular:     Rate and Rhythm: Normal rate and regular rhythm.     Pulses: Normal pulses.     Heart sounds: Normal heart sounds.  Pulmonary:     Effort: Pulmonary effort is normal.     Breath sounds: Normal breath sounds. No wheezing, rhonchi or rales.  Chest:  Breasts:    Breasts are symmetrical.     Right: No inverted nipple, mass, nipple discharge, skin change or tenderness.     Left: No inverted nipple, mass, nipple discharge, skin change or tenderness.  Abdominal:     General: Bowel sounds are normal. There is no distension.     Palpations: Abdomen is soft. Abdomen is not rigid. There is no fluid wave or mass.     Tenderness: There is no abdominal tenderness. There is no guarding or rebound.  Lymphadenopathy:     Head:     Right side of head: No submental, submandibular, tonsillar, preauricular, posterior auricular or occipital adenopathy.     Left side of head: No submental, submandibular, tonsillar, preauricular, posterior auricular or occipital adenopathy.     Cervical: No cervical adenopathy.     Right cervical: No superficial, deep or posterior cervical  adenopathy.    Left cervical: No superficial, deep or posterior cervical adenopathy.  Skin:    General: Skin is warm and dry.  Neurological:     Mental Status: She is alert.  Psychiatric:        Speech: Speech normal.        Behavior: Behavior normal.        Thought Content: Thought content normal.

## 2023-03-15 NOTE — Assessment & Plan Note (Signed)
Exacerbated by grief reaction after loss of her father.  GYN started Lexapro 10 mg.  At this time medication appears adequate.  We did discuss at length pursuing counseling.  I provided patient with names today.  She will consider this and let me know if she would like a referral

## 2023-03-15 NOTE — Addendum Note (Signed)
Addended by: [place], Marshall Roehrich on: 03/15/2023 09:56 AM   Modules accepted: Orders

## 2023-03-27 NOTE — Progress Notes (Unsigned)
Allegra Grana, FNP   No chief complaint on file.   HPI:      [name] is a 52 y.o. G1P1001 whose LMP was No LMP recorded. Patient has had a hysterectomy., presents today for ***  Started lexapro 3/24 for anxiety after sudden loss of her father.   Patient Active Problem List   Diagnosis Date Noted   Annual physical exam 03/15/2023   Body aches 11/12/2022   Shortness of breath 04/08/2022   Onychomycosis 06/14/2021   History of abnormal cervical Pap smear 05/06/2021   Anxiety and depression 05/06/2021   Chest wall pain 04/30/2021   Memory loss 04/30/2021   Morbid obesity with BMI of 45.0-49.9, adult (HCC) 11/02/2020   Arthritis, lumbar spine 11/02/2020   Arthritis of knee, right 11/02/2020   Gastroesophageal reflux disease with esophagitis without hemorrhage    Hiatal hernia    Stomach irritation    FH: colon cancer    Polyp of sigmoid colon    Polymerase chain reaction DNA test positive for herpes simplex virus type 1 (HSV-1) 03/23/2018   Overactive bladder 12/20/2017   Right elbow pain 12/18/2017   Acute bronchitis 11/27/2017   DDD (degenerative disc disease), lumbar 06/30/2017   Asthma 02/26/2017   Obesity 02/26/2017   LGSIL Pap smear of vagina 02/26/2017   Acute right-sided low back pain with right-sided sciatica 01/23/2017   Depression, recurrent (HCC) 01/23/2017    Past Surgical History:  Procedure Laterality Date   COLONOSCOPY WITH PROPOFOL N/A 09/11/2019   Procedure: COLONOSCOPY WITH PROPOFOL;  Surgeon: Pasty Spillers, MD;  Location: [facility] ENDOSCOPY;  Service: Endoscopy;  Laterality: N/A;   ESOPHAGOGASTRODUODENOSCOPY (EGD) WITH PROPOFOL N/A 09/11/2019   Procedure: ESOPHAGOGASTRODUODENOSCOPY (EGD) WITH PROPOFOL;  Surgeon: Pasty Spillers, MD;  Location: [facility] ENDOSCOPY;  Service: Endoscopy;  Laterality: N/A;   HYSTEROSCOPY WITH D & C  04/26/2011   simple hyperplasia, endometrial polyp   LAPAROSCOPIC GASTRIC SLEEVE RESECTION   2012   PUBOVAGINAL SLING  04/03/2012   using TVTdevice   TONSILLECTOMY AND ADENOIDECTOMY  2000   TOTAL VAGINAL HYSTERECTOMY  04/03/2012   uterine prolapse, she has a CERVIX    Family History  Problem Relation Age of Onset   Diabetes Father    Colon cancer Father 40   Alcohol abuse Daughter    Drug abuse Daughter     Social History   Socioeconomic History   Marital status: Single    Spouse name: Not on file   Number of children: 1   Years of education: 14   Highest education level: Not on file  Occupational History   Occupation: customer service  Tobacco Use   Smoking status: Every Day    Packs/day: .5    Types: Cigarettes   Smokeless tobacco: Never  Vaping Use   Vaping Use: Never used  Substance and Sexual Activity   Alcohol use: Not Currently   Drug use: No   Sexual activity: Not Currently    Birth control/protection: Surgical    Comment: Hysterectomy   Other Topics Concern   Not on file  Social History Narrative   Lives in [place]      Works- Health visitor      Living with parents      One daughter who has grandson ( 8 years)       Social Determinants of Health   Financial Resource Strain: Not on file  Food Insecurity: Not on file  Transportation Needs: Not on file  Physical Activity: Not on file  Stress: Not on file  Social Connections: Not on file  Intimate Partner Violence: Not on file    Outpatient Medications Prior to Visit  Medication Sig Dispense Refill   albuterol (VENTOLIN HFA) 108 (90 Base) MCG/ACT inhaler Inhale 1-2 puffs into the lungs every 6 (six) hours as needed for wheezing or shortness of breath. 18 g 2   cetirizine (ZYRTEC) 10 MG tablet Take 1 tablet (10 mg total) by mouth daily. 30 tablet 3   diclofenac Sodium (VOLTAREN) 1 % GEL Apply 4 g topically 4 (four) times daily. 50 g 3   escitalopram (LEXAPRO) 10 MG tablet Take 1 tablet (10 mg total) by mouth daily. 30 tablet 1   hydrOXYzine (ATARAX) 25 MG tablet Take 1 tablet  (25 mg total) by mouth every 6 (six) hours as needed for anxiety. 30 tablet 0   No facility-administered medications prior to visit.      ROS:  Review of Systems BREAST: No symptoms   OBJECTIVE:   Vitals:  There were no vitals taken for this visit.  Physical Exam  Results: No results found for this or any previous visit (from the past 24 hour(s)).   Assessment/Plan: No diagnosis found.    No orders of the defined types were placed in this encounter.     No follow-ups on file.  Harley Fitzwater B. Koden Hunzeker, PA-C 03/27/2023 3:16 PM

## 2023-03-28 ENCOUNTER — Encounter: Payer: Self-pay | Admitting: Obstetrics and Gynecology

## 2023-03-28 ENCOUNTER — Ambulatory Visit: Payer: BC Managed Care – PPO | Admitting: Obstetrics and Gynecology

## 2023-03-28 VITALS — BP 128/90 | Ht 65.0 in | Wt 257.0 lb

## 2023-03-28 DIAGNOSIS — F419 Anxiety disorder, unspecified: Secondary | ICD-10-CM

## 2023-03-28 DIAGNOSIS — F4321 Adjustment disorder with depressed mood: Secondary | ICD-10-CM | POA: Diagnosis not present

## 2023-03-28 DIAGNOSIS — F32A Depression, unspecified: Secondary | ICD-10-CM

## 2023-03-28 MED ORDER — ESCITALOPRAM OXALATE 10 MG PO TABS: 10.0000 mg | ORAL_TABLET | Freq: Every day | ORAL | 0 refills | Status: DC

## 2023-03-28 NOTE — Patient Instructions (Signed)
I value your feedback and you entrusting us with your care. If you get a [facility] patient survey, I would appreciate you taking the time to let us know about your experience today. Thank you! ? ? ?

## 2023-05-09 ENCOUNTER — Other Ambulatory Visit: Payer: Self-pay | Admitting: Obstetrics and Gynecology

## 2023-05-09 DIAGNOSIS — F4321 Adjustment disorder with depressed mood: Secondary | ICD-10-CM

## 2023-05-09 DIAGNOSIS — F32A Depression, unspecified: Secondary | ICD-10-CM

## 2023-05-24 ENCOUNTER — Encounter: Payer: Self-pay | Admitting: Obstetrics and Gynecology

## 2023-06-20 ENCOUNTER — Encounter: Payer: Self-pay | Admitting: Family

## 2023-07-12 NOTE — Progress Notes (Unsigned)
PCP:  Allegra Grana, FNP   No chief complaint on file.    HPI:      [name] is a 52 y.o. G1P1001 who LMP was No LMP recorded. Patient has had a hysterectomy., presents today for her annual examination.  Her menses are absent due to [facility] for uterine prolapse with pubovaginal sling.  Dysmenorrhea none. She does not have postmenopausal bleeding.   Sex activity: not currently sexually active.  Last Pap: 06/30/22  Results were neg cells/neg HPV DNA; 05/06/21 Results were ASCUS/positive HPV DNA.  02/17/17 Results were: no abnormalities /neg HPV DNA; s/p LGSIL 2015 with HPV on bx. Repeat pap today.  Hx of STDs: HPV  Hx of vaginal lesion 2018 with neg HSV 2 IgG several times, last one 7/20. Had pos HSV 1/2 combo IgM.   Last mammogram: 06/24/21 Results were normal, repeat in 12 months  There is no FH of breast cancer. There is no FH of ovarian cancer. The patient does not do self-breast exams.  Tobacco use: few cigs daily Alcohol use: none No drug use.  Exercise: not active  Colonoscopy:  10/20 with polyp; repeat due after 5 yrs; FH colon cancer in her dad, genetic testing not done for pt  She does get adequate calcium but not Vitamin D in her diet. Labs with PCP 6/22 with recent Vit D deficiency.  On lexapro 10 mg for anxiety/depression since 3/24 after loss of fathier. On hydroxyzine for insomnia  Past Medical History:  Diagnosis Date   Asthma    Condyloma acuminatum    Depression    GERD (gastroesophageal reflux disease)    LGSIL on Pap smear of cervix    Obesity     Past Surgical History:  Procedure Laterality Date   COLONOSCOPY WITH PROPOFOL N/A 09/11/2019   Procedure: COLONOSCOPY WITH PROPOFOL;  Surgeon: Pasty Spillers, MD;  Location: [facility] ENDOSCOPY;  Service: Endoscopy;  Laterality: N/A;   ESOPHAGOGASTRODUODENOSCOPY (EGD) WITH PROPOFOL N/A 09/11/2019   Procedure: ESOPHAGOGASTRODUODENOSCOPY (EGD) WITH PROPOFOL;  Surgeon: Pasty Spillers, MD;   Location: [facility] ENDOSCOPY;  Service: Endoscopy;  Laterality: N/A;   HYSTEROSCOPY WITH D & C  04/26/2011   simple hyperplasia, endometrial polyp   LAPAROSCOPIC GASTRIC SLEEVE RESECTION  2012   PUBOVAGINAL SLING  04/03/2012   using TVTdevice   TONSILLECTOMY AND ADENOIDECTOMY  2000   TOTAL VAGINAL HYSTERECTOMY  04/03/2012   uterine prolapse, she has a CERVIX    Family History  Problem Relation Age of Onset   Diabetes Father    Colon cancer Father 30   Alcohol abuse Daughter    Drug abuse Daughter     Social History   Socioeconomic History   Marital status: Single    Spouse name: Not on file   Number of children: 1   Years of education: 14   Highest education level: Not on file  Occupational History   Occupation: customer service  Tobacco Use   Smoking status: Every Day    Current packs/day: 0.50    Types: Cigarettes   Smokeless tobacco: Never  Vaping Use   Vaping status: Never Used  Substance and Sexual Activity   Alcohol use: Not Currently   Drug use: No   Sexual activity: Not Currently    Birth control/protection: Surgical    Comment: Hysterectomy   Other Topics Concern   Not on file  Social History Narrative   Lives in [place]      Works- Health visitor  Living with parents      One daughter who has grandson ( 8 years)       Social Determinants of Corporate investment banker Strain: Not on file  Food Insecurity: Not on file  Transportation Needs: Not on file  Physical Activity: Not on file  Stress: Not on file  Social Connections: Not on file  Intimate Partner Violence: Not on file    Outpatient Medications Prior to Visit  Medication Sig Dispense Refill   albuterol (VENTOLIN HFA) 108 (90 Base) MCG/ACT inhaler Inhale 1-2 puffs into the lungs every 6 (six) hours as needed for wheezing or shortness of breath. 18 g 2   cetirizine (ZYRTEC) 10 MG tablet Take 1 tablet (10 mg total) by mouth daily. 30 tablet 3   diclofenac Sodium (VOLTAREN) 1 %  GEL Apply 4 g topically 4 (four) times daily. 50 g 3   escitalopram (LEXAPRO) 10 MG tablet Take 1 tablet (10 mg total) by mouth daily. 90 tablet 0   hydrOXYzine (ATARAX) 25 MG tablet Take 1 tablet (25 mg total) by mouth every 6 (six) hours as needed for anxiety. 30 tablet 0   No facility-administered medications prior to visit.      ROS:  Review of Systems  Constitutional:  Negative for fatigue, fever and unexpected weight change.  Respiratory:  Negative for cough, shortness of breath and wheezing.   Cardiovascular:  Negative for chest pain, palpitations and leg swelling.  Gastrointestinal:  Negative for blood in stool, constipation, diarrhea, nausea and vomiting.  Endocrine: Negative for cold intolerance, heat intolerance and polyuria.  Genitourinary:  Negative for dyspareunia, dysuria, flank pain, frequency, genital sores, hematuria, menstrual problem, pelvic pain, urgency, vaginal bleeding, vaginal discharge and vaginal pain.  Musculoskeletal:  Negative for back pain, joint swelling and myalgias.  Skin:  Negative for rash.  Neurological:  Negative for dizziness, syncope, light-headedness, numbness and headaches.  Hematological:  Negative for adenopathy.  Psychiatric/Behavioral:  Negative for agitation, confusion, sleep disturbance and suicidal ideas. The patient is not nervous/anxious.   BREAST: No symptoms   Objective: There were no vitals taken for this visit.   Physical Exam Constitutional:      Appearance: She is well-developed.  Genitourinary:     Vulva normal.     Genitourinary Comments: UTERUS/CX SURG REM     Right Labia: No rash, tenderness or lesions.    Left Labia: No tenderness, lesions or rash.    Vaginal cuff intact.    No vaginal discharge, erythema or tenderness.     No vaginal atrophy present.     Right Adnexa: not tender and no mass present.    Left Adnexa: not tender and no mass present.    Cervix is absent.     Uterus is absent.  Breasts:    Right:  No mass, nipple discharge, skin change or tenderness.     Left: No mass, nipple discharge, skin change or tenderness.  Neck:     Thyroid: No thyromegaly.  Cardiovascular:     Rate and Rhythm: Normal rate and regular rhythm.     Heart sounds: Normal heart sounds. No murmur heard. Pulmonary:     Effort: Pulmonary effort is normal.     Breath sounds: Normal breath sounds.  Abdominal:     Palpations: Abdomen is soft.     Tenderness: There is no abdominal tenderness. There is no guarding.  Musculoskeletal:        General: Normal range of motion.  Cervical back: Normal range of motion.  Neurological:     General: No focal deficit present.     Mental Status: She is alert and oriented to person, place, and time.     Cranial Nerves: No cranial nerve deficit.  Skin:    General: Skin is warm and dry.  Psychiatric:        Mood and Affect: Mood normal.        Behavior: Behavior normal.        Thought Content: Thought content normal.        Judgment: Judgment normal.  Vitals reviewed.     Assessment/Plan: Encounter for annual routine gynecological examination  Cervical cancer screening - Plan: Cytology - PAP  Screening for HPV (human papillomavirus) - Plan: Cytology - PAP  ASCUS with positive high risk HPV cervical - Plan: Cytology - PAP; repeat today, will f/u with results.   Encounter for screening mammogram for malignant neoplasm of breast - Plan: MM 3D SCREEN BREAST BILATERAL; pt to schedule mammo          GYN counsel breast self exam, mammography screening, adequate intake of calcium and vitamin D, diet and exercise     F/U  No follow-ups on file.  Mikal Wisman B. Rawlin Reaume, PA-C 07/12/2023 7:12 PM

## 2023-07-13 ENCOUNTER — Ambulatory Visit (INDEPENDENT_AMBULATORY_CARE_PROVIDER_SITE_OTHER): Payer: BC Managed Care – PPO | Admitting: Obstetrics and Gynecology

## 2023-07-13 ENCOUNTER — Encounter: Payer: Self-pay | Admitting: Obstetrics and Gynecology

## 2023-07-13 VITALS — BP 117/79 | HR 83 | Ht 65.0 in | Wt 265.0 lb

## 2023-07-13 DIAGNOSIS — Z780 Asymptomatic menopausal state: Secondary | ICD-10-CM | POA: Diagnosis not present

## 2023-07-13 DIAGNOSIS — Z1231 Encounter for screening mammogram for malignant neoplasm of breast: Secondary | ICD-10-CM

## 2023-07-13 DIAGNOSIS — Z01419 Encounter for gynecological examination (general) (routine) without abnormal findings: Secondary | ICD-10-CM

## 2023-07-13 DIAGNOSIS — F4321 Adjustment disorder with depressed mood: Secondary | ICD-10-CM

## 2023-07-13 DIAGNOSIS — Z113 Encounter for screening for infections with a predominantly sexual mode of transmission: Secondary | ICD-10-CM | POA: Diagnosis not present

## 2023-07-13 DIAGNOSIS — F419 Anxiety disorder, unspecified: Secondary | ICD-10-CM

## 2023-07-13 DIAGNOSIS — F32A Depression, unspecified: Secondary | ICD-10-CM

## 2023-07-13 MED ORDER — ESCITALOPRAM OXALATE 10 MG PO TABS: 10.0000 mg | ORAL_TABLET | Freq: Every day | ORAL | 3 refills | Status: DC

## 2023-07-13 NOTE — Patient Instructions (Addendum)
I value your feedback and you entrusting us with your care. If you get a [facility] patient survey, I would appreciate you taking the time to let us know about your experience today. Thank you!  [facility] ([place])--[phone]  

## 2023-07-15 LAB — FOLLICLE STIMULATING HORMONE: FSH: 45.8 m[IU]/mL

## 2023-07-15 LAB — RPR QUALITATIVE: RPR Ser Ql: NONREACTIVE

## 2023-07-15 LAB — HEPATITIS C ANTIBODY: Hep C Virus Ab: NONREACTIVE

## 2023-07-15 LAB — HIV ANTIBODY (ROUTINE TESTING W REFLEX): HIV Screen 4th Generation wRfx: NONREACTIVE

## 2023-07-15 LAB — ESTRADIOL: Estradiol: <5 pg/mL

## 2023-07-15 LAB — HSV-2 AB, IGG: HSV 2 IgG, Type Spec: <0.91 {index} (ref 0.00–0.90)

## 2023-07-17 ENCOUNTER — Encounter: Payer: Self-pay | Admitting: Obstetrics and Gynecology

## 2023-07-25 ENCOUNTER — Encounter: Payer: Self-pay | Admitting: Family

## 2023-11-08 DIAGNOSIS — H5213 Myopia, bilateral: Secondary | ICD-10-CM | POA: Diagnosis not present

## 2024-02-16 ENCOUNTER — Telehealth: Admitting: Family Medicine

## 2024-02-16 DIAGNOSIS — A084 Viral intestinal infection, unspecified: Secondary | ICD-10-CM | POA: Diagnosis not present

## 2024-02-16 MED ORDER — ONDANSETRON 4 MG PO TBDP: 4.0000 mg | ORAL_TABLET | Freq: Three times a day (TID) | ORAL | 0 refills | Status: AC | PRN

## 2024-02-16 NOTE — Patient Instructions (Signed)

## 2024-02-16 NOTE — Progress Notes (Signed)
 Virtual Visit Consent   Judy Simmons, you are scheduled for a virtual visit with a [facility] provider today. Just as with appointments in the office, your consent must be obtained to participate. Your consent will be active for this visit and any virtual visit you may have with one of our providers in the next 365 days. If you have a MyChart account, a copy of this consent can be sent to you electronically.  As this is a virtual visit, video technology does not allow for your provider to perform a traditional examination. This may limit your provider's ability to fully assess your condition. If your provider identifies any concerns that need to be evaluated in person or the need to arrange testing (such as labs, EKG, etc.), we will make arrangements to do so. Although advances in technology are sophisticated, we cannot ensure that it will always work on either your end or our end. If the connection with a video visit is poor, the visit may have to be switched to a telephone visit. With either a video or telephone visit, we are not always able to ensure that we have a secure connection.  By engaging in this virtual visit, you consent to the provision of healthcare and authorize for your insurance to be billed (if applicable) for the services provided during this visit. Depending on your insurance coverage, you may receive a charge related to this service.  I need to obtain your verbal consent now. Are you willing to proceed with your visit today? Judy Simmons has provided verbal consent on 02/16/2024 for a virtual visit (video or telephone). Georgana Curio, FNP  Date: 02/16/2024 9:05 AM   Virtual Visit via Video Note   I, Georgana Curio, connected with  Judy Simmons  ([id number], [birth year]) on 02/16/24 at  9:00 AM EDT by a video-enabled telemedicine application and verified that I am speaking with the correct person using two identifiers.  Location: Patient: Virtual Visit  Location Patient: Home Provider: Virtual Visit Location Provider: Home Office   I discussed the limitations of evaluation and management by telemedicine and the availability of in person appointments. The patient expressed understanding and agreed to proceed.    History of Present Illness: Judy Simmons is a 53 y.o. who identifies as a female who was assigned female at birth, and is being seen today for nausea vomiting and diarrhea since tues evening. Threw up again this am. She is able to drink and is urinating. Marland Kitchen  HPI: HPI  Problems:  Patient Active Problem List   Diagnosis Date Noted   Annual physical exam 03/15/2023   Body aches 11/12/2022   Shortness of breath 04/08/2022   Onychomycosis 06/14/2021   History of abnormal cervical Pap smear 05/06/2021   Anxiety and depression 05/06/2021   Chest wall pain 04/30/2021   Memory loss 04/30/2021   Morbid obesity with BMI of 45.0-49.9, adult (HCC) 11/02/2020   Arthritis, lumbar spine 11/02/2020   Arthritis of knee, right 11/02/2020   Gastroesophageal reflux disease with esophagitis without hemorrhage    Hiatal hernia    Stomach irritation    FH: colon cancer    Polyp of sigmoid colon    Polymerase chain reaction DNA test positive for herpes simplex virus type 1 (HSV-1) 03/23/2018   Overactive bladder 12/20/2017   Right elbow pain 12/18/2017   Acute bronchitis 11/27/2017   DDD (degenerative disc disease), lumbar 06/30/2017   Asthma 02/26/2017   Obesity 02/26/2017   LGSIL Pap smear  of vagina 02/26/2017   Acute right-sided low back pain with right-sided sciatica 01/23/2017   Depression, recurrent (HCC) 01/23/2017    Allergies:  Allergies  Allergen Reactions   Nsaids Other (See Comments)    Gastric Sleeve surgery   Penicillin G Other (See Comments)    Rxn as a Child   Medications:  Current Outpatient Medications:    albuterol (VENTOLIN HFA) 108 (90 Base) MCG/ACT inhaler, Inhale 1-2 puffs into the lungs every 6 (six)  hours as needed for wheezing or shortness of breath., Disp: 18 g, Rfl: 2   cetirizine (ZYRTEC) 10 MG tablet, Take 1 tablet (10 mg total) by mouth daily., Disp: 30 tablet, Rfl: 3   diclofenac Sodium (VOLTAREN) 1 % GEL, Apply 4 g topically 4 (four) times daily. (Patient taking differently: Apply 4 g topically as needed.), Disp: 50 g, Rfl: 3   escitalopram (LEXAPRO) 10 MG tablet, Take 1 tablet (10 mg total) by mouth daily., Disp: 90 tablet, Rfl: 3   hydrOXYzine (ATARAX) 25 MG tablet, Take 1 tablet (25 mg total) by mouth every 6 (six) hours as needed for anxiety., Disp: 30 tablet, Rfl: 0  Observations/Objective: Patient is well-developed, well-nourished in no acute distress.  Resting comfortably  at home.  Head is normocephalic, atraumatic.  No labored breathing.  Speech is clear and coherent with logical content.  Patient is alert and oriented at baseline.    Assessment and Plan: 1. Viral gastroenteritis (Primary)  Increase fluids, no milk or dairy, hydrate, UC if sx persist or worsen.   Follow Up Instructions: I discussed the assessment and treatment plan with the patient. The patient was provided an opportunity to ask questions and all were answered. The patient agreed with the plan and demonstrated an understanding of the instructions.  A copy of instructions were sent to the patient via MyChart unless otherwise noted below.     The patient was advised to call back or seek an in-person evaluation if the symptoms worsen or if the condition fails to improve as anticipated.    Georgana Curio, FNP

## 2024-02-27 ENCOUNTER — Ambulatory Visit: Payer: BC Managed Care – PPO | Admitting: Dermatology

## 2024-02-27 DIAGNOSIS — Z1283 Encounter for screening for malignant neoplasm of skin: Secondary | ICD-10-CM

## 2024-02-27 DIAGNOSIS — D492 Neoplasm of unspecified behavior of bone, soft tissue, and skin: Secondary | ICD-10-CM | POA: Diagnosis not present

## 2024-02-27 DIAGNOSIS — D229 Melanocytic nevi, unspecified: Secondary | ICD-10-CM

## 2024-02-27 DIAGNOSIS — L578 Other skin changes due to chronic exposure to nonionizing radiation: Secondary | ICD-10-CM | POA: Diagnosis not present

## 2024-02-27 DIAGNOSIS — D485 Neoplasm of uncertain behavior of skin: Secondary | ICD-10-CM

## 2024-02-27 DIAGNOSIS — W891XXA Exposure to tanning bed, initial encounter: Secondary | ICD-10-CM

## 2024-02-27 DIAGNOSIS — B351 Tinea unguium: Secondary | ICD-10-CM | POA: Diagnosis not present

## 2024-02-27 DIAGNOSIS — D225 Melanocytic nevi of trunk: Secondary | ICD-10-CM

## 2024-02-27 DIAGNOSIS — L814 Other melanin hyperpigmentation: Secondary | ICD-10-CM

## 2024-02-27 DIAGNOSIS — D2239 Melanocytic nevi of other parts of face: Secondary | ICD-10-CM

## 2024-02-27 DIAGNOSIS — L821 Other seborrheic keratosis: Secondary | ICD-10-CM

## 2024-02-27 DIAGNOSIS — D2271 Melanocytic nevi of right lower limb, including hip: Secondary | ICD-10-CM

## 2024-02-27 DIAGNOSIS — W908XXA Exposure to other nonionizing radiation, initial encounter: Secondary | ICD-10-CM

## 2024-02-27 DIAGNOSIS — D1801 Hemangioma of skin and subcutaneous tissue: Secondary | ICD-10-CM

## 2024-02-27 DIAGNOSIS — Z7189 Other specified counseling: Secondary | ICD-10-CM

## 2024-02-27 HISTORY — DX: Melanocytic nevi, unspecified: D22.9

## 2024-02-27 MED ORDER — TERBINAFINE HCL 250 MG PO TABS: 250.0000 mg | ORAL_TABLET | Freq: Every day | ORAL | 0 refills | Status: DC

## 2024-02-27 MED ORDER — TAVABOROLE 5 % EX SOLN: CUTANEOUS | 2 refills | Status: AC

## 2024-02-27 NOTE — Progress Notes (Signed)
 New Patient Visit   Subjective  Judy Simmons is a 53 y.o. female who presents for the following: Skin Cancer Screening and Full Body Skin Exam  The patient presents for Total-Body Skin Exam (TBSE) for skin cancer screening and mole check. The patient has spots, moles and lesions to be evaluated, some may be new or changing.  She has seen a dermatologist in the past, several years ago. No history of skin cancer or abnormal moles. Regular tanning bed use in the past. Pt also wants toenails checked.  She has done oral treatment in the past, but wasn't consistent taking the medicine.  The following portions of the chart were reviewed this encounter and updated as appropriate: medications, allergies, medical history  Review of Systems:  No other skin or systemic complaints except as noted in HPI or Assessment and Plan.  Objective  Well appearing patient in no apparent distress; mood and affect are within normal limits.  A full examination was performed including scalp, head, eyes, ears, nose, lips, neck, chest, axillae, abdomen, back, buttocks, bilateral upper extremities, bilateral lower extremities, hands, feet, fingers, toes, fingernails, and toenails. All findings within normal limits unless otherwise noted below.   Relevant physical exam findings are noted in the Assessment and Plan.       Right Posterior Flank 4 x 3 mm dark brown macule       Assessment & Plan   SKIN CANCER SCREENING PERFORMED TODAY.  ACTINIC DAMAGE - Chronic condition, secondary to cumulative UV/sun exposure - diffuse scaly erythematous macules with underlying dyspigmentation - Recommend daily broad spectrum sunscreen SPF 30+ to sun-exposed areas, reapply every 2 hours as needed.  - Staying in the shade or wearing long sleeves, sun glasses (UVA+UVB protection) and wide brim hats (4-inch brim around the entire circumference of the hat) are also recommended for sun protection.  - Call for new or  changing lesions.  LENTIGINES, SEBORRHEIC KERATOSES, HEMANGIOMAS - Benign normal skin lesions - Benign-appearing - Call for any changes  ONYCHOMYCOSIS Exam: Thickened toenails with subungal debris c/w onychomycosis, photos today  Chronic and persistent condition with duration or expected duration over one year. Condition is symptomatic/ bothersome to patient. Not currently at goal.  Treatment Plan: CMP reviewed from 03/15/2023, LFTs wnl. Lab order given for patient to have done in 1 month.  Start terbinafine 250 MG take 1 po every day with food dsp #30 0Rf.  Start Tavabarole 5% solution - apply to toenails nightly.  Terbinafine Counseling  Terbinafine is an anti-fungal medicine that can be applied to the skin (over the counter) or taken by mouth (prescription) to treat fungal infections. The pill version is often used to treat fungal infections of the nails or scalp. While most people do not have any side effects from taking terbinafine pills, some possible side effects of the medicine can include taste changes, headache, loss of smell, vision changes, nausea, vomiting, or diarrhea.   Rare side effects can include irritation of the liver, allergic reaction, or decrease in blood counts (which may show up as not feeling well or developing an infection). If you are concerned about any of these side effects, please stop the medicine and call your doctor, or in the case of an emergency such as feeling very unwell, seek immediate medical care.    IRRITATED NEVI  Exam: Flesh papules at L chin 5 mm,  R chin 6 mm, spinal mid back at bra line sup 5 mm and inf 5 mm.  Treatment:  Patient would like to have removed since symptomatic. Will remove up to 4 at a time. Schedule in 30 min slot.  Discussed resulting small scar with shave removal, and possible recurrence of lesion.  Recommend vaseline ointment to area daily and cover until healed.  Recommend photoprotection/sunscreen to area to prevent  discoloration of scar.  Once healed, may apply OTC Serica scar gel bid to thickened scars.    MELANOCYTIC NEVI - Tan-brown and/or pink-flesh-colored symmetric macules and papules - R pretibia 3 mm regular brown macule with adjacent spider vein - R ant thigh 3 x 4 mm speckled brown macule - L chest 5 x 4 mm speckled brown macule - L plantar foot at ball inf to 4th toe 10 x 6 mm flesh tan papule - Benign appearing on exam today - Observation - Call clinic for new or changing moles - Recommend daily use of broad spectrum spf 30+ sunscreen to sun-exposed areas. NEOPLASM OF UNCERTAIN BEHAVIOR OF SKIN Right Posterior Flank Epidermal / dermal shaving  Lesion diameter (cm):  0.5 Informed consent: discussed and consent obtained   Patient was prepped and draped in usual sterile fashion: Area prepped with alcohol. Anesthesia: the lesion was anesthetized in a standard fashion   Anesthetic:  1% lidocaine w/ epinephrine 1-100,000 buffered w/ 8.4% NaHCO3 Instrument used: flexible razor blade   Hemostasis achieved with: pressure, aluminum chloride and electrodesiccation   Outcome: patient tolerated procedure well   Post-procedure details: wound care instructions given   Post-procedure details comment:  Ointment and small bandage applied Specimen 1 - Surgical pathology Differential Diagnosis: Nevus r/o Dysplasia Check Margins: Yes ONYCHOMYCOSIS   Related Procedures Hepatic Function Panel Return in about 3 months (around 05/28/2024) for tinea. Also 30 min appt to remove irritated nevi.Wendee Beavers, CMA, am acting as scribe for Willeen Niece, MD .   Documentation: I have reviewed the above documentation for accuracy and completeness, and I agree with the above.  Willeen Niece, MD

## 2024-02-27 NOTE — Patient Instructions (Addendum)
 Start Terbinafine 250 MG - take 1 tablet by mouth once a day with food. Have labs done in 1 month at [facility]. Once we receive results, we still send in 2 additional refills.   Terbinafine Counseling  Terbinafine is an anti-fungal medicine that can be applied to the skin (over the counter) or taken by mouth (prescription) to treat fungal infections. The pill version is often used to treat fungal infections of the nails or scalp. While most people do not have any side effects from taking terbinafine pills, some possible side effects of the medicine can include taste changes, headache, loss of smell, vision changes, nausea, vomiting, or diarrhea.   Rare side effects can include irritation of the liver, allergic reaction, or decrease in blood counts (which may show up as not feeling well or developing an infection). If you are concerned about any of these side effects, please stop the medicine and call your doctor, or in the case of an emergency such as feeling very unwell, seek immediate medical care.    Wound Care Instructions  Cleanse wound gently with soap and water once a day then pat dry with clean gauze. Apply a thin coat of Petrolatum (petroleum jelly, "Vaseline") over the wound (unless you have an allergy to this). We recommend that you use a new, sterile tube of Vaseline. Do not pick or remove scabs. Do not remove the yellow or white "healing tissue" from the base of the wound.  Cover the wound with fresh, clean, nonstick gauze and secure with paper tape. You may use Band-Aids in place of gauze and tape if the wound is small enough, but would recommend trimming much of the tape off as there is often too much. Sometimes Band-Aids can irritate the skin.  You should call the office for your biopsy report after 1 week if you have not already been contacted.  If you experience any problems, such as abnormal amounts of bleeding, swelling, significant bruising, significant pain, or evidence of  infection, please call the office immediately.  FOR ADULT SURGERY PATIENTS: If you need something for pain relief you may take 1 extra strength Tylenol (acetaminophen) AND 2 Ibuprofen (200mg  each) together every 4 hours as needed for pain. (do not take these if you are allergic to them or if you have a reason you should not take them.) Typically, you may only need pain medication for 1 to 3 days.     Melanoma ABCDEs  Melanoma is the most dangerous type of skin cancer, and is the leading cause of death from skin disease.  You are more likely to develop melanoma if you: Have light-colored skin, light-colored eyes, or red or blond hair Spend a lot of time in the sun Tan regularly, either outdoors or in a tanning bed Have had blistering sunburns, especially during childhood Have a close family member who has had a melanoma Have atypical moles or large birthmarks  Early detection of melanoma is key since treatment is typically straightforward and cure rates are extremely high if we catch it early.   The first sign of melanoma is often a change in a mole or a new dark spot.  The ABCDE system is a way of remembering the signs of melanoma.  A for asymmetry:  The two halves do not match. B for border:  The edges of the growth are irregular. C for color:  A mixture of colors are present instead of an even brown color. D for diameter:  Melanomas are usually (  but not always) greater than 6mm - the size of a pencil eraser. E for evolution:  The spot keeps changing in size, shape, and color.  Please check your skin once per month between visits. You can use a small mirror in front and a large mirror behind you to keep an eye on the back side or your body.   If you see any new or changing lesions before your next follow-up, please call to schedule a visit.  Please continue daily skin protection including broad spectrum sunscreen SPF 30+ to sun-exposed areas, reapplying every 2 hours as needed when  you're outdoors.   Staying in the shade or wearing long sleeves, sun glasses (UVA+UVB protection) and wide brim hats (4-inch brim around the entire circumference of the hat) are also recommended for sun protection.    Due to recent changes in healthcare laws, you may see results of your pathology and/or laboratory studies on MyChart before the doctors have had a chance to review them. We understand that in some cases there may be results that are confusing or concerning to you. Please understand that not all results are received at the same time and often the doctors may need to interpret multiple results in order to provide you with the best plan of care or course of treatment. Therefore, we ask that you please give [place] 2 business days to thoroughly review all your results before contacting the office for clarification. Should we see a critical lab result, you will be contacted sooner.   If You Need Anything After Your Visit  If you have any questions or concerns for your doctor, please call our main line at [phone] and press option 4 to reach your doctor's medical assistant. If no one answers, please leave a voicemail as directed and we will return your call as soon as possible. Messages left after 4 pm will be answered the following business day.   You may also send [place] a message via MyChart. We typically respond to MyChart messages within 1-2 business days.  For prescription refills, please ask your pharmacy to contact our office. Our fax number is [phone].  If you have an urgent issue when the clinic is closed that cannot wait until the next business day, you can page your doctor at the number below.    Please note that while we do our best to be available for urgent issues outside of office hours, we are not available 24/7.   If you have an urgent issue and are unable to reach [place], you may choose to seek medical care at your doctor's office, retail clinic, urgent care center, or  emergency room.  If you have a medical emergency, please immediately call 911 or go to the emergency department.  Pager Numbers  - Dr. Gwen Pounds: [phone]  - Dr. Roseanne Reno: [phone]  - Dr. Katrinka Blazing: [phone]   In the event of inclement weather, please call our main line at [phone] for an update on the status of any delays or closures.  Dermatology Medication Tips: Please keep the boxes that topical medications come in in order to help keep track of the instructions about where and how to use these. Pharmacies typically print the medication instructions only on the boxes and not directly on the medication tubes.   If your medication is too expensive, please contact our office at [phone] option 4 or send [place] a message through MyChart.   We are unable to tell what your co-pay for medications will be  in advance as this is different depending on your insurance coverage. However, we may be able to find a substitute medication at lower cost or fill out paperwork to get insurance to cover a needed medication.   If a prior authorization is required to get your medication covered by your insurance company, please allow [place] 1-2 business days to complete this process.  Drug prices often vary depending on where the prescription is filled and some pharmacies may offer cheaper prices.  The website www.goodrx.com contains coupons for medications through different pharmacies. The prices here do not account for what the cost may be with help from insurance (it may be cheaper with your insurance), but the website can give you the price if you did not use any insurance.  - You can print the associated coupon and take it with your prescription to the pharmacy.  - You may also stop by our office during regular business hours and pick up a GoodRx coupon card.  - If you need your prescription sent electronically to a different pharmacy, notify our office through [facility] or by phone at  [phone] option 4.     Si Usted Necesita Algo Despus de Su Visita  Tambin puede enviarnos un mensaje a travs de Clinical cytogeneticist. Por lo general respondemos a los mensajes de MyChart en el transcurso de 1 a 2 das hbiles.  Para renovar recetas, por favor pida a su farmacia que se ponga en contacto con nuestra oficina. Annie Sable de fax es [place] [phone].  Si tiene un asunto urgente cuando la clnica est cerrada y que no puede esperar hasta el siguiente da hbil, puede llamar/localizar a su doctor(a) al nmero que aparece a continuacin.   Por favor, tenga en cuenta que aunque hacemos todo lo posible para estar disponibles para asuntos urgentes fuera del horario de [place], no estamos disponibles las 24 horas del da, los 7 [address]  [address] de la [place].   Si tiene un problema urgente y no puede comunicarse con nosotros, puede optar por buscar atencin mdica  en el consultorio de su doctor(a), en una clnica privada, en un centro de atencin urgente o en una sala de emergencias.  Si tiene Engineer, drilling, por favor llame inmediatamente al 911 o vaya a la sala de emergencias.  Nmeros de bper  - Dr. Gwen Pounds: [phone]  - Dra. Roseanne Reno: [phone]  - Dr. Katrinka Blazing: [phone]   En caso de inclemencias del tiempo, por favor llame a Lacy Duverney principal al [phone] para una actualizacin sobre el [place] de cualquier retraso o cierre.  Consejos para la medicacin en dermatologa: Por favor, guarde las cajas en las que vienen los medicamentos de uso tpico para ayudarle a seguir las instrucciones sobre dnde y cmo usarlos. Las farmacias generalmente imprimen las instrucciones del medicamento slo en las cajas y no directamente en los tubos del [place].   Si su medicamento es muy caro, por favor, pngase en contacto con Rolm Gala llamando al [phone] y presione la opcin 4 o envenos un mensaje a travs de Clinical cytogeneticist.   No podemos decirle cul ser su copago por los  medicamentos por adelantado ya que esto es diferente dependiendo de la cobertura de su seguro. Sin embargo, es posible que podamos encontrar un medicamento sustituto a Audiological scientist un formulario para que el seguro cubra el medicamento que se considera necesario.   Si se requiere una autorizacin previa para que su compaa de seguros [place] su medicamento, por favor permtanos de 1  a 2 das hbiles para completar [address]  Los precios de los medicamentos varan con frecuencia dependiendo del Environmental consultant de dnde se surte la receta y alguna farmacias pueden ofrecer precios ms baratos.  El sitio web www.goodrx.com tiene cupones para medicamentos de Health and safety inspector. Los precios aqu no tienen en cuenta lo que podra costar con la ayuda del seguro (puede ser ms barato con su seguro), pero el sitio web puede darle el precio si no utiliz Tourist information centre manager.  - Puede imprimir el cupn correspondiente y llevarlo con su receta a la farmacia.  - Tambin puede pasar por nuestra oficina durante el horario de atencin regular y Education officer, museum una tarjeta de cupones de GoodRx.  - Si necesita que su receta se enve electrnicamente a una farmacia diferente, informe a nuestra oficina a travs de MyChart de [place] o por telfono llamando al [phone] y presione la opcin 4.

## 2024-02-29 LAB — SURGICAL PATHOLOGY

## 2024-03-04 ENCOUNTER — Telehealth: Payer: Self-pay

## 2024-03-04 NOTE — Telephone Encounter (Signed)
 Left pt msg to call for bx result/sh

## 2024-03-04 NOTE — Telephone Encounter (Signed)
 Discussed biopsy results with patient, plan on re shave at her scheduled appt 04/03/24

## 2024-03-04 NOTE — Telephone Encounter (Signed)
-----   Message from Willeen Niece sent at 03/04/2024 12:25 PM EDT ----- 1. Skin, right posterior flank :       DYSPLASTIC NEVUS WITH MODERATE TO SEVERE ATYPIA, MARGIN CLOSE, SEE DESCRIPTION   Moderate to severely atypical mole, needs repeat shave removal - please call patient

## 2024-04-03 ENCOUNTER — Encounter: Payer: Self-pay | Admitting: Dermatology

## 2024-04-03 ENCOUNTER — Ambulatory Visit: Admitting: Dermatology

## 2024-04-03 DIAGNOSIS — D2272 Melanocytic nevi of left lower limb, including hip: Secondary | ICD-10-CM | POA: Diagnosis not present

## 2024-04-03 DIAGNOSIS — D225 Melanocytic nevi of trunk: Secondary | ICD-10-CM | POA: Diagnosis not present

## 2024-04-03 DIAGNOSIS — D2239 Melanocytic nevi of other parts of face: Secondary | ICD-10-CM

## 2024-04-03 DIAGNOSIS — D2271 Melanocytic nevi of right lower limb, including hip: Secondary | ICD-10-CM | POA: Diagnosis not present

## 2024-04-03 DIAGNOSIS — D229 Melanocytic nevi, unspecified: Secondary | ICD-10-CM

## 2024-04-03 DIAGNOSIS — L988 Other specified disorders of the skin and subcutaneous tissue: Secondary | ICD-10-CM | POA: Diagnosis not present

## 2024-04-03 DIAGNOSIS — D492 Neoplasm of unspecified behavior of bone, soft tissue, and skin: Secondary | ICD-10-CM

## 2024-04-03 NOTE — Patient Instructions (Signed)

## 2024-04-03 NOTE — Progress Notes (Signed)
 Follow-Up Visit   Subjective  Judy Simmons is a 53 y.o. female who presents for the following: mole removal. C/O irritated moles, rubbed by clothing. Chin and back. Patient has decided that she does not want both moles on chin removed today.   Re-shave moderate to severely dysplastic nevus. Right posterior flank. Bx 02/27/2024.  The patient has spots, moles and lesions to be evaluated, some may be new or changing and the patient may have concern these could be cancer.    The following portions of the chart were reviewed this encounter and updated as appropriate: medications, allergies, medical history  Review of Systems:  No other skin or systemic complaints except as noted in HPI or Assessment and Plan.  Objective  Well appearing patient in no apparent distress; mood and affect are within normal limits.  A focused examination was performed of the following areas: Face, back Relevant physical exam findings are noted in the Assessment and Plan.  Right Chin 6 mm flesh papule at jaw  Right Posterior Flank 6 mm pink healing biopsy site Spinal mid back at bra line superior 5 mm flesh papule  Spinal mid back at bra line inferior 5 mm flesh papule  Assessment & Plan      MELANOCYTIC NEVI - Tan-brown and/or pink-flesh-colored symmetric macules and papules - L chin 5 mm,  R chin 6 mm, spinal mid back at bra line sup 5 mm and inf 5 mm- removed today- see procedure note - R pretibia 3 mm regular brown macule with adjacent spider vein - 7 mm specked brown papule at left upper back - R ant thigh 3 x 4 mm speckled brown macule - L chest 5 x 4 mm speckled brown macule - L plantar foot at ball inf to 4th toe 10 x 6 mm flesh tan papule - Benign appearing on exam today - Observation - Call clinic for new or changing moles - Recommend daily use of broad spectrum spf 30+ sunscreen to sun-exposed areas. NEOPLASM OF SKIN (4) Right Chin Epidermal / dermal shaving  Lesion diameter  (cm):  0.6 Informed consent: discussed and consent obtained   Patient was prepped and draped in usual sterile fashion: Area prepped with alcohol. Anesthesia: the lesion was anesthetized in a standard fashion   Anesthetic:  1% lidocaine  w/ epinephrine 1-100,000 buffered w/ 8.4% NaHCO3 Instrument used: flexible razor blade   Hemostasis achieved with: pressure, aluminum chloride and electrodesiccation   Outcome: patient tolerated procedure well   Post-procedure details: wound care instructions given   Post-procedure details comment:  Ointment and small bandage applied Specimen 1 - Surgical pathology Differential Diagnosis: Irritated nevus, R/O atypia  Check Margins: Yes     Right Posterior Flank Epidermal / dermal shaving  Lesion diameter (cm):  0.6 Informed consent: discussed and consent obtained   Patient was prepped and draped in usual sterile fashion: Area prepped with alcohol. Anesthesia: the lesion was anesthetized in a standard fashion   Anesthetic:  1% lidocaine  w/ epinephrine 1-100,000 buffered w/ 8.4% NaHCO3 Instrument used: flexible razor blade   Hemostasis achieved with: pressure, aluminum chloride and electrodesiccation   Outcome: patient tolerated procedure well   Post-procedure details: wound care instructions given   Post-procedure details comment:  Ointment and small bandage applied Specimen 2 - Surgical pathology Differential Diagnosis: R/O residual mod-severely dysplastic nevus  Check Margins: Yes  [id number] Spinal mid back at bra line superior Epidermal / dermal shaving  Lesion diameter (cm):  0.5 Informed consent: discussed and  consent obtained   Patient was prepped and draped in usual sterile fashion: Area prepped with alcohol. Anesthesia: the lesion was anesthetized in a standard fashion   Anesthetic:  1% lidocaine  w/ epinephrine 1-100,000 buffered w/ 8.4% NaHCO3 Instrument used: flexible razor blade   Hemostasis achieved with: pressure, aluminum  chloride and electrodesiccation   Outcome: patient tolerated procedure well   Post-procedure details: wound care instructions given   Post-procedure details comment:  Ointment and small bandage applied Specimen 3 - Surgical pathology Differential Diagnosis: Irritated nevus, R/O atypia   Check Margins: Yes Spinal mid back at bra line inferior Epidermal / dermal shaving  Lesion diameter (cm):  0.5 Informed consent: discussed and consent obtained   Patient was prepped and draped in usual sterile fashion: Area prepped with alcohol. Anesthesia: the lesion was anesthetized in a standard fashion   Anesthetic:  1% lidocaine  w/ epinephrine 1-100,000 buffered w/ 8.4% NaHCO3 Instrument used: flexible razor blade   Hemostasis achieved with: pressure, aluminum chloride and electrodesiccation   Outcome: patient tolerated procedure well   Post-procedure details: wound care instructions given   Post-procedure details comment:  Ointment and small bandage applied Specimen 4 - Surgical pathology Differential Diagnosis: Irritated nevus, R/O atypia   Check Margins: Yes   Return for Tinea Follow Up As Scheduled.  I, Jill Parcell, CMA, am acting as scribe for Artemio Larry, MD.   Documentation: I have reviewed the above documentation for accuracy and completeness, and I agree with the above.  Artemio Larry, MD

## 2024-04-08 ENCOUNTER — Telehealth: Payer: Self-pay

## 2024-04-08 LAB — SURGICAL PATHOLOGY

## 2024-04-08 NOTE — Telephone Encounter (Signed)
 Advised pt of bx results/sh ?

## 2024-04-08 NOTE — Telephone Encounter (Signed)
-----   Message from Artemio Larry sent at 04/08/2024  5:39 PM EDT ----- 1. Skin, right chin :       MELANOCYTIC NEVUS, INTRADERMAL TYPE, IRRITATED, BASE INVOLVED 2. Skin, right posterior flank :       NO RESIDUAL DYSPLASTIC NEVUS, MARGINS FREE  3. Skin, spinal mid back at bra line superior :       MELANOCYTIC NEVUS, INTRADERMAL TYPE, IRRITATED WITH CONGENITAL PATTERN, BASE       INVOLVED  4. Skin, spinal mid back at bra line inferior :       MELANOCYTIC NEVUS, INTRADERMAL TYPE, IRRITATED WITH CONGENITAL PATTERN, BASE       INVOLVED   1. Benign mole 2. H/o Atypical mole, margins free 3. Benign irritated mole 4. Benign irritated mole   - please call patient

## 2024-04-09 ENCOUNTER — Encounter: Payer: Self-pay | Admitting: Dermatology

## 2024-05-06 ENCOUNTER — Encounter: Payer: Self-pay | Admitting: Family

## 2024-05-06 NOTE — Telephone Encounter (Signed)
 LOV was 02/2023?? Pt has new provider, do you want to fill??

## 2024-05-06 NOTE — Telephone Encounter (Signed)
 Spoke to pt informed her that rx was sent in and pt verbalized understanding, scheduled f/up appt for he she wanted to know if you were accepting new pts because her Mom and her friends husband needs pcp?

## 2024-05-07 ENCOUNTER — Other Ambulatory Visit: Payer: Self-pay

## 2024-05-07 DIAGNOSIS — J069 Acute upper respiratory infection, unspecified: Secondary | ICD-10-CM

## 2024-05-07 DIAGNOSIS — J4541 Moderate persistent asthma with (acute) exacerbation: Secondary | ICD-10-CM

## 2024-05-07 MED ORDER — ALBUTEROL SULFATE HFA 108 (90 BASE) MCG/ACT IN AERS: 1.0000 | INHALATION_SPRAY | Freq: Four times a day (QID) | RESPIRATORY_TRACT | 2 refills | Status: AC | PRN

## 2024-05-08 ENCOUNTER — Telehealth: Admitting: Physician Assistant

## 2024-05-08 DIAGNOSIS — J4521 Mild intermittent asthma with (acute) exacerbation: Secondary | ICD-10-CM

## 2024-05-08 MED ORDER — PREDNISONE 20 MG PO TABS: 40.0000 mg | ORAL_TABLET | Freq: Every day | ORAL | 0 refills | Status: DC

## 2024-05-08 MED ORDER — BENZONATATE 100 MG PO CAPS: 100.0000 mg | ORAL_CAPSULE | Freq: Three times a day (TID) | ORAL | 0 refills | Status: DC | PRN

## 2024-05-08 NOTE — Progress Notes (Signed)
 E-Visit for Asthma  Based on what you have shared with me, it looks like you may have a flare up of your asthma.  Asthma is a chronic (ongoing) lung disease which results in airway obstruction, inflammation and hyper-responsiveness.   Asthma symptoms vary from person to person, with common symptoms including nighttime awakening and decreased ability to participate in normal activities as a result of shortness of breath. It is often triggered by changes in weather, changes in the season, changes in air temperature, or inside (home, school, daycare or work) allergens such as animal dander, mold, mildew, woodstoves or cockroaches.   It can also be triggered by hormonal changes, extreme emotion, physical exertion or an upper respiratory tract illness.     It is important to identify the trigger, and then eliminate or avoid the trigger if possible.   If you have been prescribed medications to be taken on a regular basis, it is important to follow the asthma action plan and to follow guidelines to adjust medication in response to increasing symptoms of decreased peak expiratory flow rate  Treatment: I have prescribed: Prednisone  40mg  by mouth per day for 5 - 7 days. I have also sent in a prescription cough medication to take as directed. You can continue you rescue inhaler as directed, when needed along with this. I do recommend a follow-up with your PCP as well for ongoing management.  HOME CARE Only take medications as instructed by your medical team. Consider wearing a mask or scarf to improve breathing air temperature have been shown to decrease irritation and decrease exacerbations Get rest. Taking a steamy shower or using a humidifier may help nasal congestion sand ease sore throat pain. You can place a towel over your head and breathe in the steam from hot water coming from a  faucet. Using a saline nasal spray works much the same way.  Cough drops, hare candies and sore throat lozenges may ease your cough.  Avoid close contacts especially the very you and the elderly Cover your mouth if you cough or sneeze Always remember to wash your hands.    GET HELP RIGHT AWAY IF: You develop worsening symptoms; breathlessness at rest, drowsy, confused or agitated, unable to speak in full sentences You have coughing fits You develop a severe headache or visual changes You develop shortness of breath, difficulty breathing or start having chest pain Your symptoms persist after you have completed your treatment plan If your symptoms do not improve within 10 days  MAKE SURE YOU Understand these instructions. Will watch your condition. Will get help right away if you are not doing well or get worse.   Your e-visit answers were reviewed by a board certified advanced clinical practitioner to complete your personal care plan, Depending upon the condition, your plan could have included both over the counter or prescription medications.   Please review your pharmacy choice. Your safety is important to us . If you have drug allergies check your prescription carefully.  You can use MyChart to ask questions about today's visit, request a non-urgent  call back, or ask for a work or school excuse for 24 hours related to this e-Visit. If it has been greater than 24 hours you will need to follow up with your provider, or enter a new e-Visit to address those concerns.   You will get an e-mail in the next two days asking about your experience. I hope that your e-visit has been valuable and will speed your recovery. Thank you  for using e-visits.

## 2024-05-08 NOTE — Progress Notes (Signed)
 I have spent 5 minutes in review of e-visit questionnaire, review and updating patient chart, medical decision making and response to patient.   Piedad Climes, PA-C

## 2024-05-20 ENCOUNTER — Ambulatory Visit: Admitting: Family

## 2024-05-28 ENCOUNTER — Ambulatory Visit: Admitting: Family

## 2024-06-06 ENCOUNTER — Encounter: Payer: Self-pay | Admitting: Family

## 2024-06-06 ENCOUNTER — Ambulatory Visit (INDEPENDENT_AMBULATORY_CARE_PROVIDER_SITE_OTHER): Admitting: Family

## 2024-06-06 VITALS — BP 128/78 | HR 93 | Temp 97.5°F | Ht 64.0 in | Wt 278.2 lb

## 2024-06-06 DIAGNOSIS — Z13220 Encounter for screening for lipoid disorders: Secondary | ICD-10-CM | POA: Diagnosis not present

## 2024-06-06 DIAGNOSIS — E66813 Obesity, class 3: Secondary | ICD-10-CM

## 2024-06-06 DIAGNOSIS — Z136 Encounter for screening for cardiovascular disorders: Secondary | ICD-10-CM

## 2024-06-06 DIAGNOSIS — J4541 Moderate persistent asthma with (acute) exacerbation: Secondary | ICD-10-CM | POA: Diagnosis not present

## 2024-06-06 DIAGNOSIS — F419 Anxiety disorder, unspecified: Secondary | ICD-10-CM | POA: Diagnosis not present

## 2024-06-06 DIAGNOSIS — Z6841 Body Mass Index (BMI) 40.0 and over, adult: Secondary | ICD-10-CM

## 2024-06-06 DIAGNOSIS — F4321 Adjustment disorder with depressed mood: Secondary | ICD-10-CM

## 2024-06-06 DIAGNOSIS — F32A Depression, unspecified: Secondary | ICD-10-CM

## 2024-06-06 MED ORDER — BUDESONIDE-FORMOTEROL FUMARATE 80-4.5 MCG/ACT IN AERO: 2.0000 | INHALATION_SPRAY | Freq: Two times a day (BID) | RESPIRATORY_TRACT | 3 refills | Status: AC

## 2024-06-06 MED ORDER — ESCITALOPRAM OXALATE 10 MG PO TABS: 10.0000 mg | ORAL_TABLET | Freq: Every day | ORAL | 3 refills | Status: DC

## 2024-06-06 NOTE — Progress Notes (Signed)
 Assessment & Plan:  Moderate persistent asthma with acute exacerbation Assessment & Plan: Reassuring exam without adventitious lung sounds.   Suboptimal control of sxs. D/D includes asthma v COPD. She is a longstanding smoker. Discussed triggers.  Trial of symbicort . Resume OTC antihistamine. Continue albuterol  prn If symptoms do not improve, advised formal evaluation with pulmonology. Close follow up.   Orders: -     CBC with Differential/Platelet; Future -     Comprehensive metabolic panel with GFR; Future -     Budesonide -Formoterol  Fumarate; Inhale 2 puffs into the lungs 2 (two) times daily.  Dispense: 1 each; Refill: 3  Anxiety and depression Assessment & Plan: Chronic, stable.  Continue Lexapro  10 mg.   Orders: -     TSH; Future -     Escitalopram  Oxalate; Take 1 tablet (10 mg total) by mouth daily.  Dispense: 90 tablet; Refill: 3  Grief -     Escitalopram  Oxalate; Take 1 tablet (10 mg total) by mouth daily.  Dispense: 90 tablet; Refill: 3  Encounter for lipid screening for cardiovascular disease -     Lipid panel; Future  Class 3 severe obesity due to excess calories without serious comorbidity with body mass index (BMI) of 40.0 to 44.9 in adult -     VITAMIN D  25 Hydroxy (Vit-D Deficiency, Fractures); Future -     Hemoglobin A1c; Future -     CBC with Differential/Platelet; Future -     Comprehensive metabolic panel with GFR; Future -     Lipid panel; Future -     TSH; Future     Return precautions given.   Risks, benefits, and alternatives of the medications and treatment plan prescribed today were discussed, and patient expressed understanding.   Education regarding symptom management and diagnosis given to patient on AVS either electronically or printed.  Return in about 6 weeks (around 07/18/2024) for Fasting labs in 2-3 weeks.  [name], FNP  Subjective:    Patient ID: Judy Simmons, female    DOB: 1970/12/14, 53 y.o.   MRN:  [id number]  CC: Judy Simmons is a 53 y.o. female who presents today for follow up.   HPI: She has h/o suspected asthma ; she followed with pulmonology years ago Exacerbated by seasonal allergies, viruses.  Wheezing will start and she will use albuterol  prn with relief, however there are times when she albuterol  seems to take too long.  Wheezing is episodic.  Denies SOB, CP, leg swelling, orthopnea   She is a smoker.  She is doing well on Lexapro   Allergies: Nsaids and Penicillin g Current Outpatient Medications on File Prior to Visit  Medication Sig Dispense Refill   albuterol  (VENTOLIN  HFA) 108 (90 Base) MCG/ACT inhaler Inhale 1-2 puffs into the lungs every 6 (six) hours as needed for wheezing or shortness of breath. 18 g 2   cetirizine  (ZYRTEC ) 10 MG tablet Take 1 tablet (10 mg total) by mouth daily. 30 tablet 3   diclofenac  Sodium (VOLTAREN ) 1 % GEL Apply 4 g topically 4 (four) times daily. (Patient taking differently: Apply 4 g topically as needed.) 50 g 3   Tavaborole  5 % SOLN Apply to toenails at bedtime 10 mL 2   terbinafine  (LAMISIL ) 250 MG tablet Take 1 tablet (250 mg total) by mouth daily. Take with food. 30 tablet 0   No current facility-administered medications on file prior to visit.    Review of Systems  Constitutional:  Negative for chills and fever.  Respiratory:  Positive for cough and wheezing. Negative for shortness of breath.   Cardiovascular:  Negative for chest pain, palpitations and leg swelling.  Gastrointestinal:  Negative for nausea and vomiting.      Objective:    BP 128/78   Pulse 93   Temp (!) 97.5 F (36.4 C) (Oral)   Ht 5' 4 (1.626 m)   Wt 278 lb 3.2 oz (126.2 kg)   SpO2 98%   BMI 47.75 kg/m  BP Readings from Last 3 Encounters:  06/06/24 128/78  07/13/23 117/79  03/28/23 (!) 128/90   Wt Readings from Last 3 Encounters:  06/06/24 278 lb 3.2 oz (126.2 kg)  07/13/23 265 lb (120.2 kg)  03/28/23 257 lb (116.6 kg)       06/06/2024    3:00 PM 07/13/2023   10:16 AM 03/28/2023   10:23 AM  Depression screen PHQ 2/9  Decreased Interest 0 0 1  Down, Depressed, Hopeless 0 1 1  PHQ - 2 Score 0 1 2  Altered sleeping 0 1 3  Tired, decreased energy 0 0 1  Change in appetite 0 0 3  Feeling bad or failure about yourself  0 0 1  Trouble concentrating 0 1 2  Moving slowly or fidgety/restless 0 0 0  Suicidal thoughts 0 0 0  PHQ-9 Score 0 3 12  Difficult doing work/chores Not difficult at all Not difficult at all Somewhat difficult      06/06/2024    3:01 PM 07/13/2023   10:16 AM 03/28/2023   10:23 AM 03/15/2023    9:06 AM  GAD 7 : Generalized Anxiety Score  Nervous, Anxious, on Edge 0 0 1 1  Control/stop worrying 0 0 1 1  Worry too much - different things 0 0 1 2  Trouble relaxing 0 0 0 1  Restless 0 0 0 0  Easily annoyed or irritable 0 0 1 1  Afraid - awful might happen 0 0 1 1  Total GAD 7 Score 0 0 5 7  Anxiety Difficulty Not difficult at all Not difficult at all Somewhat difficult Somewhat difficult      Physical Exam Vitals reviewed.  Constitutional:      Appearance: She is well-developed.  Eyes:     Conjunctiva/sclera: Conjunctivae normal.  Cardiovascular:     Rate and Rhythm: Normal rate and regular rhythm.     Pulses: Normal pulses.     Heart sounds: Normal heart sounds.  Pulmonary:     Effort: Pulmonary effort is normal.     Breath sounds: Normal breath sounds. No wheezing, rhonchi or rales.  Skin:    General: Skin is warm and dry.  Neurological:     Mental Status: She is alert.  Psychiatric:        Speech: Speech normal.        Behavior: Behavior normal.        Thought Content: Thought content normal.

## 2024-06-06 NOTE — Assessment & Plan Note (Addendum)
 Reassuring exam without adventitious lung sounds.   Suboptimal control of sxs. D/D includes asthma v COPD. She is a longstanding smoker. Discussed triggers.  Trial of symbicort . Resume OTC antihistamine. Continue albuterol  prn If symptoms do not improve, advised formal evaluation with pulmonology. Close follow up.

## 2024-06-07 NOTE — Patient Instructions (Signed)
 Trial of Symbicort  for wheezing.    please ensure that you wash your mouth out after use due to it risk of thrush.  Nice seeing you today

## 2024-06-07 NOTE — Assessment & Plan Note (Signed)
Chronic, stable  Continue Lexapro 10 mg

## 2024-06-18 ENCOUNTER — Ambulatory Visit: Admitting: Dermatology

## 2024-06-18 DIAGNOSIS — Z79899 Other long term (current) drug therapy: Secondary | ICD-10-CM

## 2024-06-18 DIAGNOSIS — Z86018 Personal history of other benign neoplasm: Secondary | ICD-10-CM

## 2024-06-18 DIAGNOSIS — L814 Other melanin hyperpigmentation: Secondary | ICD-10-CM | POA: Diagnosis not present

## 2024-06-18 DIAGNOSIS — B351 Tinea unguium: Secondary | ICD-10-CM

## 2024-06-18 DIAGNOSIS — D2261 Melanocytic nevi of right upper limb, including shoulder: Secondary | ICD-10-CM

## 2024-06-18 DIAGNOSIS — D229 Melanocytic nevi, unspecified: Secondary | ICD-10-CM

## 2024-06-18 MED ORDER — TERBINAFINE HCL 250 MG PO TABS: 250.0000 mg | ORAL_TABLET | Freq: Every day | ORAL | 2 refills | Status: DC

## 2024-06-18 NOTE — Patient Instructions (Signed)

## 2024-06-18 NOTE — Progress Notes (Signed)
   Follow-Up Visit   Subjective  Judy Simmons is a 53 y.o. female who presents for the following: f/u hx of dysplastic nevus of the right posterior flank, mod to severe. Patient with onychomycosis of the toenails, treated with terbinafine  <1 month but stopped once she had trauma to the left great toenail. No side effects while she was taking it. She was also using Tavaborole  solution. She has an itchy spot on the right shoulder to check.   The following portions of the chart were reviewed this encounter and updated as appropriate: medications, allergies, medical history  Review of Systems:  No other skin or systemic complaints except as noted in HPI or Assessment and Plan.  Objective  Well appearing patient in no apparent distress; mood and affect are within normal limits.   Relevant physical exam findings are noted in the Assessment and Plan.       Assessment & Plan   LENTIGINES Exam: scattered tan macules Due to sun exposure Treatment Plan: Benign-appearing, observe. Recommend daily broad spectrum sunscreen SPF 30+ to sun-exposed areas, reapply every 2 hours as needed.  Call for any changes  HISTORY OF DYSPLASTIC NEVUS Right posterior flank, mod to severe, shave removal 04/03/2024 No evidence of recurrence today Recommend regular full body skin exams Recommend daily broad spectrum sunscreen SPF 30+ to sun-exposed areas, reapply every 2 hours as needed.  Call if any new or changing lesions are noted between office visits  IRRITATED NEVI Exam:  4 mm firm tan regular papule on the right shoulder; Flesh papule at L chin 5 mm   Treatment Plan: Benign appearing on exam today. Recommend observation. Call clinic for new or changing moles. Recommend daily use of broad spectrum spf 30+ sunscreen to sun-exposed areas. Recommend 1% hydrocortisone cream to right shoulder as needed for itching. Discussed shave removal if not improving. Patient will schedule   ONYCHOMYCOSIS Exam:  Thickened toenails with subungal debris; purpura at the left great toenail; 2 mm of clear nail at base of right great toenail. See photos  Chronic and persistent condition with duration or expected duration over one year. Condition is symptomatic/ bothersome to patient. Not currently at goal.   Treatment Plan: Restart terbinafine  250 mg take 1 po every day with food dsp #30 2Rf. (Finish 2 weeks worth from April's Rx that was not finished) Restart Tavaborole  5% solution at bedtime to toenails.    Return 4-6 months, for tinea. Patient will sched additional appointment to remove irritated nevi. SABRA LILLETTE Andrea Ezzard, CMA, am acting as scribe for Rexene Rattler, MD .   Documentation: I have reviewed the above documentation for accuracy and completeness, and I agree with the above.  Rexene Rattler, MD

## 2024-06-25 ENCOUNTER — Other Ambulatory Visit (INDEPENDENT_AMBULATORY_CARE_PROVIDER_SITE_OTHER)

## 2024-06-25 DIAGNOSIS — Z136 Encounter for screening for cardiovascular disorders: Secondary | ICD-10-CM | POA: Diagnosis not present

## 2024-06-25 DIAGNOSIS — F32A Depression, unspecified: Secondary | ICD-10-CM | POA: Diagnosis not present

## 2024-06-25 DIAGNOSIS — E66813 Obesity, class 3: Secondary | ICD-10-CM | POA: Diagnosis not present

## 2024-06-25 DIAGNOSIS — J4541 Moderate persistent asthma with (acute) exacerbation: Secondary | ICD-10-CM

## 2024-06-25 DIAGNOSIS — Z13220 Encounter for screening for lipoid disorders: Secondary | ICD-10-CM | POA: Diagnosis not present

## 2024-06-25 DIAGNOSIS — Z6841 Body Mass Index (BMI) 40.0 and over, adult: Secondary | ICD-10-CM | POA: Diagnosis not present

## 2024-06-25 DIAGNOSIS — F419 Anxiety disorder, unspecified: Secondary | ICD-10-CM | POA: Diagnosis not present

## 2024-06-25 LAB — CBC WITH DIFFERENTIAL/PLATELET
Basophils Absolute: 0 K/uL (ref 0.0–0.1)
Basophils Relative: 1 % (ref 0.0–3.0)
Eosinophils Absolute: 0.1 K/uL (ref 0.0–0.7)
Eosinophils Relative: 3.4 % (ref 0.0–5.0)
HCT: 39.7 % (ref 36.0–46.0)
Hemoglobin: 13.1 g/dL (ref 12.0–15.0)
Lymphocytes Relative: 40.6 % (ref 12.0–46.0)
Lymphs Abs: 1.7 K/uL (ref 0.7–4.0)
MCHC: 33 g/dL (ref 30.0–36.0)
MCV: 89.5 fl (ref 78.0–100.0)
Monocytes Absolute: 0.4 K/uL (ref 0.1–1.0)
Monocytes Relative: 10.1 % (ref 3.0–12.0)
Neutro Abs: 1.8 K/uL (ref 1.4–7.7)
Neutrophils Relative %: 44.9 % (ref 43.0–77.0)
Platelets: 282 K/uL (ref 150.0–400.0)
RBC: 4.43 Mil/uL (ref 3.87–5.11)
RDW: 13.8 % (ref 11.5–15.5)
WBC: 4.1 K/uL (ref 4.0–10.5)

## 2024-06-25 LAB — COMPREHENSIVE METABOLIC PANEL WITH GFR
ALT: 19 U/L (ref 0–35)
AST: 22 U/L (ref 0–37)
Albumin: 4.2 g/dL (ref 3.5–5.2)
Alkaline Phosphatase: 67 U/L (ref 39–117)
BUN: 16 mg/dL (ref 6–23)
CO2: 26 meq/L (ref 19–32)
Calcium: 9.4 mg/dL (ref 8.4–10.5)
Chloride: 107 meq/L (ref 96–112)
Creatinine, Ser: 0.82 mg/dL (ref 0.40–1.20)
GFR: 83.05 mL/min (ref >60.00)
Glucose, Bld: 74 mg/dL (ref 70–99)
Potassium: 3.9 meq/L (ref 3.5–5.1)
Sodium: 141 meq/L (ref 135–145)
Total Bilirubin: 0.4 mg/dL (ref 0.2–1.2)
Total Protein: 6.7 g/dL (ref 6.0–8.3)

## 2024-06-25 LAB — LIPID PANEL
Cholesterol: 184 mg/dL (ref 0–200)
HDL: 55.2 mg/dL (ref >39.00)
LDL Cholesterol: 115 mg/dL — ABNORMAL HIGH (ref 0–99)
NonHDL: 129.21
Total CHOL/HDL Ratio: 3
Triglycerides: 69 mg/dL (ref 0.0–149.0)
VLDL: 13.8 mg/dL (ref 0.0–40.0)

## 2024-06-25 LAB — TSH: TSH: 3.55 u[IU]/mL (ref 0.35–5.50)

## 2024-06-25 LAB — VITAMIN D 25 HYDROXY (VIT D DEFICIENCY, FRACTURES): VITD: 21.5 ng/mL — ABNORMAL LOW (ref 30.00–100.00)

## 2024-06-25 LAB — HEMOGLOBIN A1C: Hgb A1c MFr Bld: 5.5 % (ref 4.6–6.5)

## 2024-06-26 ENCOUNTER — Ambulatory Visit: Payer: Self-pay | Admitting: Family

## 2024-07-03 ENCOUNTER — Ambulatory Visit: Admitting: Dermatology

## 2024-07-03 DIAGNOSIS — D239 Other benign neoplasm of skin, unspecified: Secondary | ICD-10-CM | POA: Insufficient documentation

## 2024-07-03 DIAGNOSIS — D485 Neoplasm of uncertain behavior of skin: Secondary | ICD-10-CM

## 2024-07-03 DIAGNOSIS — D2239 Melanocytic nevi of other parts of face: Secondary | ICD-10-CM

## 2024-07-03 DIAGNOSIS — L739 Follicular disorder, unspecified: Secondary | ICD-10-CM

## 2024-07-03 DIAGNOSIS — D225 Melanocytic nevi of trunk: Secondary | ICD-10-CM | POA: Diagnosis not present

## 2024-07-03 HISTORY — DX: Other benign neoplasm of skin, unspecified: D23.9

## 2024-07-03 NOTE — Patient Instructions (Addendum)
 Recommend OTC Scalpicin Extra Strength solution for spot treating to affected areas of scalp as needed for itch.  Wound Care Instructions  Cleanse wound gently with soap and water once a day then pat dry with clean gauze. Apply a thin coat of Petrolatum (petroleum jelly, Vaseline) over the wound (unless you have an allergy to this). We recommend that you use a new, sterile tube of Vaseline. Do not pick or remove scabs. Do not remove the yellow or white healing tissue from the base of the wound.  Cover the wound with fresh, clean, nonstick gauze and secure with paper tape. You may use Band-Aids in place of gauze and tape if the wound is small enough, but would recommend trimming much of the tape off as there is often too much. Sometimes Band-Aids can irritate the skin.  You should call the office for your biopsy report after 1 week if you have not already been contacted.  If you experience any problems, such as abnormal amounts of bleeding, swelling, significant bruising, significant pain, or evidence of infection, please call the office immediately.  FOR ADULT SURGERY PATIENTS: If you need something for pain relief you may take 1 extra strength Tylenol (acetaminophen) AND 2 Ibuprofen (200mg  each) together every 4 hours as needed for pain. (do not take these if you are allergic to them or if you have a reason you should not take them.) Typically, you may only need pain medication for 1 to 3 days.     Due to recent changes in healthcare laws, you may see results of your pathology and/or laboratory studies on MyChart before the doctors have had a chance to review them. We understand that in some cases there may be results that are confusing or concerning to you. Please understand that not all results are received at the same time and often the doctors may need to interpret multiple results in order to provide you with the best plan of care or course of treatment. Therefore, we ask that you please  give us  2 business days to thoroughly review all your results before contacting the office for clarification. Should we see a critical lab result, you will be contacted sooner.   If You Need Anything After Your Visit  If you have any questions or concerns for your doctor, please call our main line at [phone] and press option 4 to reach your doctor's medical assistant. If no one answers, please leave a voicemail as directed and we will return your call as soon as possible. Messages left after 4 pm will be answered the following business day.   You may also send us  a message via MyChart. We typically respond to MyChart messages within 1-2 business days.  For prescription refills, please ask your pharmacy to contact our office. Our fax number is [phone].  If you have an urgent issue when the clinic is closed that cannot wait until the next business day, you can page your doctor at the number below.    Please note that while we do our best to be available for urgent issues outside of office hours, we are not available 24/7.   If you have an urgent issue and are unable to reach us , you may choose to seek medical care at your doctor's office, retail clinic, urgent care center, or emergency room.  If you have a medical emergency, please immediately call 911 or go to the emergency department.  Pager Numbers  - Dr. Hester: [phone]  - Dr. Jackquline:  [phone]  - Dr. Claudene: [phone]   In the event of inclement weather, please call our main line at [phone] for an update on the status of any delays or closures.  Dermatology Medication Tips: Please keep the boxes that topical medications come in in order to help keep track of the instructions about where and how to use these. Pharmacies typically print the medication instructions only on the boxes and not directly on the medication tubes.   If your medication is too expensive, please contact our office at [phone]  option 4 or send us  a message through MyChart.   We are unable to tell what your co-pay for medications will be in advance as this is different depending on your insurance coverage. However, we may be able to find a substitute medication at lower cost or fill out paperwork to get insurance to cover a needed medication.   If a prior authorization is required to get your medication covered by your insurance company, please allow us  1-2 business days to complete this process.  Drug prices often vary depending on where the prescription is filled and some pharmacies may offer cheaper prices.  The website www.goodrx.com contains coupons for medications through different pharmacies. The prices here do not account for what the cost may be with help from insurance (it may be cheaper with your insurance), but the website can give you the price if you did not use any insurance.  - You can print the associated coupon and take it with your prescription to the pharmacy.  - You may also stop by our office during regular business hours and pick up a GoodRx coupon card.  - If you need your prescription sent electronically to a different pharmacy, notify our office through [facility] or by phone at [phone] option 4.     Si Usted Necesita Algo Despus de Su Visita  Tambin puede enviarnos un mensaje a travs de Clinical cytogeneticist. Por lo general respondemos a los mensajes de MyChart en el transcurso de 1 a 2 das hbiles.  Para renovar recetas, por favor pida a su farmacia que se ponga en contacto con nuestra oficina. Randi lakes de fax es [place] [phone].  Si tiene un asunto urgente cuando la clnica est cerrada y que no puede esperar hasta el siguiente da hbil, puede llamar/localizar a su doctor(a) al nmero que aparece a continuacin.   Por favor, tenga en cuenta que aunque hacemos todo lo posible para estar disponibles para asuntos urgentes fuera del horario de [place], no estamos disponibles las  24 horas del da, los 7 [address]  [address] de la [place].   Si tiene un problema urgente y no puede comunicarse con nosotros, puede optar por buscar atencin mdica  en el consultorio de su doctor(a), en una clnica privada, en un centro de atencin urgente o en una sala de emergencias.  Si tiene Engineer, drilling, por favor llame inmediatamente al 911 o vaya a la sala de emergencias.  Nmeros de bper  - Dr. Hester: [phone]  - Dra. Jackquline: [phone]  - Dr. Claudene: [phone]   En caso de inclemencias del tiempo, por favor llame a landry capes principal al [phone] para una actualizacin sobre el [place] de cualquier retraso o cierre.  Consejos para la medicacin en dermatologa: Por favor, guarde las cajas en las que vienen los medicamentos de uso tpico para ayudarle a seguir las instrucciones sobre dnde y cmo usarlos. Las farmacias generalmente imprimen las instrucciones del medicamento slo en las cajas y  no directamente en los tubos del medicamento.   Si su medicamento es muy caro, por favor, pngase en contacto con landry rieger llamando al [phone] y presione la opcin 4 o envenos un mensaje a travs de Clinical cytogeneticist.   No podemos decirle cul ser su copago por los medicamentos por adelantado ya que esto es diferente dependiendo de la cobertura de su seguro. Sin embargo, es posible que podamos encontrar un medicamento sustituto a Audiological scientist un formulario para que el seguro cubra el medicamento que se considera necesario.   Si se requiere una autorizacin previa para que su compaa de seguros malta su medicamento, por favor permtanos de 1 a 2 das hbiles para completar este proceso.  Los precios de los medicamentos varan con frecuencia dependiendo del Environmental consultant de dnde se surte la receta y alguna farmacias pueden ofrecer precios ms baratos.  El sitio web www.goodrx.com tiene cupones para medicamentos de Health and safety inspector. Los precios aqu no tienen en cuenta  lo que podra costar con la ayuda del seguro (puede ser ms barato con su seguro), pero el sitio web puede darle el precio si no utiliz Tourist information centre manager.  - Puede imprimir el cupn correspondiente y llevarlo con su receta a la farmacia.  - Tambin puede pasar por nuestra oficina durante el horario de atencin regular y Education officer, museum una tarjeta de cupones de GoodRx.  - Si necesita que su receta se enve electrnicamente a una farmacia diferente, informe a nuestra oficina a travs de MyChart de [place] o por telfono llamando al [phone] y presione la opcin 4.

## 2024-07-03 NOTE — Progress Notes (Signed)
 Follow-Up Visit   Subjective  Judy Simmons is a 53 y.o. female who presents for the following: Spot on scalp that patient is concerned about as well as spots on her left lower back and left chin that she would like removed today.   The following portions of the chart were reviewed this encounter and updated as appropriate: medications, allergies, medical history  Review of Systems:  No other skin or systemic complaints except as noted in HPI or Assessment and Plan.  Objective  Well appearing patient in no apparent distress; mood and affect are within normal limits.  A focused examination was performed of the following areas: Scalp, back, face.   Relevant exam findings are noted in the Assessment and Plan.  Left chin inferior 0.4 cm flesh colored papule.  R spinal lower back 0.6 cm flesh/tan papule.   L spinal lower back 0.6 cm flesh/tan papule.  Assessment & Plan   FOLLICULITIS Exam: healing excoriation of the vertex scalp.   Folliculitis occurs due to inflammation of the superficial hair follicle (pore), resulting in acne-like lesions (pus bumps). It can be infectious (bacterial, fungal) or noninfectious (shaving, tight clothing, heat/sweat, medications).  Folliculitis can be acute or chronic and recommended treatment depends on the underlying cause of folliculitis.  Treatment Plan: Recommend OTC Scalpicin Extra Strength solution for spot treating to affected areas of scalp 2-3x/day as needed for itchy bumps.   Recommend CeraVe Dandruff shampoo/conditioner    NEOPLASM OF UNCERTAIN BEHAVIOR OF SKIN (3) Left chin inferior Epidermal / dermal shaving  Lesion diameter (cm):  0.4 Informed consent: discussed and consent obtained   Timeout: patient name, date of birth, surgical site, and procedure verified   Procedure prep:  Patient was prepped and draped in usual sterile fashion Prep type:  Isopropyl alcohol Anesthesia: the lesion was anesthetized in a standard  fashion   Anesthetic:  1% lidocaine  w/ epinephrine 1-100,000 buffered w/ 8.4% NaHCO3 Instrument used: flexible razor blade   Hemostasis achieved with: pressure, aluminum chloride and electrodesiccation   Outcome: patient tolerated procedure well   Post-procedure details: sterile dressing applied and wound care instructions given   Dressing type: bandage (Mupirocin 2% ointment)    Specimen 1 - Surgical pathology Differential Diagnosis: Irritated nevus vs other Check Margins: yes R spinal lower back Epidermal / dermal shaving  Lesion diameter (cm):  0.6 Informed consent: discussed and consent obtained   Timeout: patient name, date of birth, surgical site, and procedure verified   Patient was prepped and draped in usual sterile fashion: area prepped with alcohol. Anesthesia: the lesion was anesthetized in a standard fashion   Anesthetic:  1% lidocaine  w/ epinephrine 1-100,000 local infiltration Instrument used: flexible razor blade   Hemostasis achieved with: pressure, aluminum chloride and electrodesiccation   Outcome: patient tolerated procedure well   Post-procedure details: wound care instructions given   Post-procedure details comment:  Ointment and a small bandage applied  Specimen 2 - Surgical pathology Differential Diagnosis: Irritated nevus vs other Check Margins: Yes L spinal lower back Epidermal / dermal shaving  Lesion diameter (cm):  0.6 Informed consent: discussed and consent obtained   Timeout: patient name, date of birth, surgical site, and procedure verified   Patient was prepped and draped in usual sterile fashion: area prepped with alcohol. Anesthesia: the lesion was anesthetized in a standard fashion   Anesthetic:  1% lidocaine  w/ epinephrine 1-100,000 local infiltration Instrument used: flexible razor blade   Hemostasis achieved with: pressure, aluminum chloride and electrodesiccation  Outcome: patient tolerated procedure well   Post-procedure details:  wound care instructions given   Post-procedure details comment:  Ointment and a small bandage applied  Specimen 3 - Surgical pathology Differential Diagnosis: Irritated nevus vs other Check Margins: Yes  Return for Appointment as scheduled.  I, Emerick Ege, CMA am acting as scribe for Rexene Rattler, MD   Documentation: I have reviewed the above documentation for accuracy and completeness, and I agree with the above.  Rexene Rattler, MD

## 2024-07-09 ENCOUNTER — Ambulatory Visit: Payer: Self-pay | Admitting: Dermatology

## 2024-07-09 LAB — SURGICAL PATHOLOGY

## 2024-07-10 ENCOUNTER — Encounter: Payer: Self-pay | Admitting: Dermatology

## 2024-07-10 NOTE — Telephone Encounter (Signed)
-----   Message from Rexene Rattler sent at 07/09/2024  7:10 PM EDT ----- 1. Skin, left chin inferior :       MELANOCYTIC NEVUS, INTRADERMAL TYPE, PERIPHERAL AND DEEP MARGINS INVOLVED  2. Skin, R spinal lower back :       DYSPLASTIC COMPOUND NEVUS WITH MILD ATYPIA, DEEP MARGIN INVOLVED  3. Skin, L spinal lower back :       MELANOCYTIC NEVUS, COMPOUND TYPE, BASE INVOLVED   Benign mole Mildly atypical mole, observation Benign mole  - please call patient ----- Message ----- From: Interface, Lab In Three Zero One Sent: 07/09/2024   6:54 PM EDT To: Rexene Rattler, MD

## 2024-07-10 NOTE — Telephone Encounter (Signed)
 Advised patient of biopsy results. Will recheck mild dysplastic nevus on follow-up.

## 2024-07-10 NOTE — Telephone Encounter (Signed)
 Tried calling patient to discuss bx results no answer and vm full./sh

## 2024-07-23 ENCOUNTER — Ambulatory Visit (INDEPENDENT_AMBULATORY_CARE_PROVIDER_SITE_OTHER)

## 2024-07-23 ENCOUNTER — Ambulatory Visit: Admitting: Family

## 2024-07-23 ENCOUNTER — Encounter: Payer: Self-pay | Admitting: Family

## 2024-07-23 VITALS — BP 128/76 | HR 67 | Temp 97.7°F | Ht 64.5 in | Wt 276.4 lb

## 2024-07-23 DIAGNOSIS — R0602 Shortness of breath: Secondary | ICD-10-CM | POA: Diagnosis not present

## 2024-07-23 DIAGNOSIS — J4541 Moderate persistent asthma with (acute) exacerbation: Secondary | ICD-10-CM

## 2024-07-23 DIAGNOSIS — Z1231 Encounter for screening mammogram for malignant neoplasm of breast: Secondary | ICD-10-CM

## 2024-07-23 DIAGNOSIS — M25531 Pain in right wrist: Secondary | ICD-10-CM | POA: Diagnosis not present

## 2024-07-23 DIAGNOSIS — M542 Cervicalgia: Secondary | ICD-10-CM | POA: Insufficient documentation

## 2024-07-23 DIAGNOSIS — M79631 Pain in right forearm: Secondary | ICD-10-CM | POA: Diagnosis not present

## 2024-07-23 MED ORDER — CYCLOBENZAPRINE HCL 5 MG PO TABS
5.0000 mg | ORAL_TABLET | Freq: Every evening | ORAL | 1 refills | Status: AC | PRN
Start: 2024-07-23 — End: ?

## 2024-07-23 NOTE — Progress Notes (Unsigned)
 Assessment & Plan:  There are no diagnoses linked to this encounter.   Return precautions given.   Risks, benefits, and alternatives of the medications and treatment plan prescribed today were discussed, and patient expressed understanding.   Education regarding symptom management and diagnosis given to patient on AVS either electronically or printed.  No follow-ups on file.  [name], FNP  Subjective:    Patient ID: Judy Simmons, female    DOB: Jul 02, 1971, 53 y.o.   MRN: [id number]  CC: Judy Simmons is a 53 y.o. female who presents today for follow up.   HPI: She complains of bilateral anterior shoulder pain and posterior neck pain, and right wrist pain  Onset after fall in the shower ; she describes slipping in the shower.   She thinks the braced herself with arms and she hit right hip and low back to bathtub faucet  No syncope, dizziness, loc, head injury, numbness in upper extremities, weakness in upper arms.   Tylenol 500mg  once to twice per day with relief.    She never started Symbicort .     She has very sporadic productive cough. Denies CP, SOB, epigastric burning.    Allergies: Nsaids and Penicillin g Current Outpatient Medications on File Prior to Visit  Medication Sig Dispense Refill   albuterol  (VENTOLIN  HFA) 108 (90 Base) MCG/ACT inhaler Inhale 1-2 puffs into the lungs every 6 (six) hours as needed for wheezing or shortness of breath. 18 g 2   budesonide -formoterol  (SYMBICORT ) 80-4.5 MCG/ACT inhaler Inhale 2 puffs into the lungs 2 (two) times daily. 1 each 3   cetirizine  (ZYRTEC ) 10 MG tablet Take 1 tablet (10 mg total) by mouth daily. 30 tablet 3   diclofenac  Sodium (VOLTAREN ) 1 % GEL Apply 4 g topically 4 (four) times daily. (Patient taking differently: Apply 4 g topically as needed.) 50 g 3   escitalopram  (LEXAPRO ) 10 MG tablet Take 1 tablet (10 mg total) by mouth daily. 90 tablet 3   Tavaborole  5 % SOLN Apply to toenails at  bedtime 10 mL 2   terbinafine  (LAMISIL ) 250 MG tablet Take 1 tablet (250 mg total) by mouth daily. Take with food. 30 tablet 2   No current facility-administered medications on file prior to visit.    Review of Systems    Objective:    There were no vitals taken for this visit. BP Readings from Last 3 Encounters:  06/06/24 128/78  07/13/23 117/79  03/28/23 (!) 128/90   Wt Readings from Last 3 Encounters:  06/06/24 278 lb 3.2 oz (126.2 kg)  07/13/23 265 lb (120.2 kg)  03/28/23 257 lb (116.6 kg)    Physical Exam Vitals reviewed.  Constitutional:      Appearance: She is well-developed.  HENT:     Mouth/Throat:     Pharynx: Uvula midline. No pharyngeal swelling, oropharyngeal exudate, posterior oropharyngeal erythema, uvula swelling or postnasal drip.  Eyes:     Conjunctiva/sclera: Conjunctivae normal.  Cardiovascular:     Rate and Rhythm: Normal rate and regular rhythm.     Pulses: Normal pulses.     Heart sounds: Normal heart sounds.  Pulmonary:     Effort: Pulmonary effort is normal.     Breath sounds: Normal breath sounds. No wheezing, rhonchi or rales.  Musculoskeletal:       Arms:     Comments: Right ulnar surface  Skin:    General: Skin is warm and dry.  Neurological:     Mental Status: She is  alert.  Psychiatric:        Speech: Speech normal.        Behavior: Behavior normal.        Thought Content: Thought content normal.

## 2024-07-23 NOTE — Patient Instructions (Addendum)
 For cough , over the counter azelastine ( antihistamine) nasal and/or flonase ( steriod). Continue claritin  Trial of symbicort  ( low dose steroid).   Schedule tylenol 1000mg  twice daily Flexeril  ( muscle relaxant) at bedtime  ICE ICE ICE as we discussed    Shoulder Range of Motion Exercises Shoulder range of motion (ROM) exercises are done to keep the shoulder moving freely or to increase movement. They are recommended for people who have shoulder pain or stiffness or who are recovering from a shoulder surgery. Ask your health care provider which exercises are safe for you. Do exercises exactly as told by your health care provider and adjust them as directed. It is normal to feel mild stretching, pulling, tightness, or discomfort as you do these exercises. Stop right away if you feel sudden pain or your pain gets worse. Do not begin these exercises until told by your health care provider. Phase 1 exercise When you are able, do this exercise 1-2 times a day for 30-60 seconds in each direction, or as directed by your health care provider. Pendulum exercise     To do this exercise while sitting: Sit in a chair or at the edge of your bed with your feet flat on the floor. Let your affected arm hang down in front of you over the edge of the bed or chair. Relax your shoulder, arm, and hand. Rock your body so your arm gently swings in small circles. You can also use your unaffected arm to start the motion. Repeat, changing the direction of the circles, swinging your arm left and right, and swinging your arm forward and back. To do this exercise while standing: Stand next to a sturdy chair or table, and hold on to it with your hand on your unaffected side. Bend forward at the waist. Bend your knees slightly. Relax your shoulder, arm, and hand. While keeping your shoulder relaxed, use body motion to swing your arm in small circles. Repeat, changing the direction of the circles, swinging your  arm left and right, and swinging your arm forward and back. Between exercises, stand up tall and take a short break to relax your lower back.  Phase 2 exercises Do these exercises 1-2 times a day or as told by your health care provider. Hold each stretch for 30 seconds, and repeat 3 times. Do the exercises with one or both arms as instructed by your health care provider. For these exercises, sit at a table with your hand and arm supported by the table. A chair that slides easily or has wheels can be helpful. External rotation  Turn your chair so that your affected side is nearest to the table. Place your forearm on the table to your side. Bend your arm to about a 90-degree angle (right angle) at the elbow, and place your hand palm-down on the table. Your elbow should be about 6 inches (15 cm) away from your side. Keeping your arm on the table, lean your body forward. Abduction  Turn your chair so that your affected side is nearest to the table. Place your forearm and hand on the table so that your thumb points toward the ceiling and your arm is straight out to your side. Slide your hand out to the side and away from you. To increase the stretch, you can slide your chair away from the table. Flexion: forward stretch  Sit facing the table. Place your hand and elbow on the table in front of you. Slide your hand forward  and away from you, using your unaffected arm to do the work. To increase the stretch, you can slide your chair backward. Phase 3 exercises Do these exercises 1-2 times a day or as told by your health care provider. Hold each stretch for 30 seconds, and repeat 3 times. Do the exercises with one or both arms as instructed by your health care provider. You will need a cane, a piece of PVC pipe, or a sturdy wooden dowel for the wand exercises. Cross-body stretch: posterior capsule stretch  Lift your arm straight out in front of you. Bend your arm in a 90-degree angle (right angle)  at the elbow so your forearm moves across your body. Use your other arm to gently pull the elbow across your body, toward your other shoulder. Wall climbs  Stand with your affected arm extended out to the side with your hand resting on a door frame. Slide your hand slowly up the door frame. To increase the stretch, step through the door frame. Keep your body upright and do not lean. Flexion     To do this exercise while standing: Hold the wand with both of your hands, palms-down. Lift the wand up and over your head, if able. Lift mostly with your affected arm, and use the other arm to help. Push upward with your other arm to gently increase the stretch. To do this exercise while lying down: Lie on your back with your elbows resting on the floor and the wand in both your hands. Your hands will be palm-down, or pointing toward your feet. Lift your hands toward the ceiling, using your unaffected arm to help if needed. Bring your arms overhead as able, using your unaffected arm to help if needed.  Internal rotation  Stand while holding the wand behind you with both hands. Your unaffected arm should be extended above your head with the arm of the affected side extended behind you at the level of your waist. The wand should be pointing straight up and down as you hold it. Slowly pull the wand up behind your back by straightening the elbow of your unaffected arm and bending the elbow of your affected arm. External rotation  Lie on your back with your affected upper arm supported on a small pillow or rolled towel. When you first do this exercise, keep your upper arm close to your body. Over time, bring your arm up to a 90-degree angle (right angle) out to the side. Hold the wand across your stomach and with both hands palm-up. Your elbow on your affected side should be bent at a 90-degree angle. Use your unaffected side to help push your forearm away from you and toward the floor. Keep your elbow  on your affected side bent at a 90-degree angle. This information is not intended to replace advice given to you by your health care provider. Make sure you discuss any questions you have with your health care provider. Document Revised: 01/04/2022 Document Reviewed: 01/04/2022 Elsevier Patient Education  2024 [employer]Cervical Strain and Sprain Rehab Ask your health care provider which exercises are safe for you. Do exercises exactly as told by your health care provider and adjust them as directed. It is normal to feel mild stretching, pulling, tightness, or discomfort as you do these exercises. Stop right away if you feel sudden pain or your pain gets worse. Do not begin these exercises until told by your health care provider. Stretching and range-of-motion exercises Cervical side bending  Using good  posture, sit on a stable chair or stand up. Without moving your shoulders, slowly tilt your left / right ear to your shoulder until you feel a stretch in the neck muscles on the opposite side. You should be looking straight ahead. Hold for __________ seconds. Repeat with the other side of your neck. Repeat __________ times. Complete this exercise __________ times a day. Cervical rotation  Using good posture, sit on a stable chair or stand up. Slowly turn your head to the side as if you are looking over your left / right shoulder. Keep your eyes level with the ground. Stop when you feel a stretch along the side and the back of your neck. Hold for __________ seconds. Repeat this by turning to your other side. Repeat __________ times. Complete this exercise __________ times a day. Thoracic extension and pectoral stretch  Roll a towel or a small blanket so it is about 4 inches (10 cm) in diameter. Lie down on your back on a firm surface. Put the towel in the middle of your back across your spine. It should not be under your shoulder blades. Put your hands behind your head and let your  elbows fall out to your sides. Hold for __________ seconds. Repeat __________ times. Complete this exercise __________ times a day. Strengthening exercises Upper cervical flexion  Lie on your back with a thin pillow behind your head or a small, rolled-up towel under your neck. Gently tuck your chin toward your chest and nod your head down to look toward your feet. Do not lift your head off the pillow. Hold for __________ seconds. Release the tension slowly. Relax your neck muscles completely before you repeat this exercise. Repeat __________ times. Complete this exercise __________ times a day. Cervical extension  Stand about 6 inches (15 cm) away from a wall, with your back facing the wall. Place a soft object, about 6-8 inches (15-20 cm) in diameter, between the back of your head and the wall. A soft object could be a small pillow, a ball, or a folded towel. Gently tilt your head back and press into the soft object. Keep your jaw and forehead relaxed. Hold for __________ seconds. Release the tension slowly. Relax your neck muscles completely before you repeat this exercise. Repeat __________ times. Complete this exercise __________ times a day. Posture and body mechanics Body mechanics refer to the movements and positions of your body while you do your daily activities. Posture is part of body mechanics. Good posture and healthy body mechanics can help to relieve stress in your body's tissues and joints. Good posture means that your spine is in its natural S-curve position (your spine is neutral), your shoulders are pulled back slightly, and your head is not tipped forward. The following are general guidelines for using improved posture and body mechanics in your everyday activities. Sitting  When sitting, keep your spine neutral and keep your feet flat on the floor. Use a footrest, if needed, and keep your thighs parallel to the floor. Avoid rounding your shoulders. Avoid tilting your head  forward. When working at a desk or a computer, keep your desk at a height where your hands are slightly lower than your elbows. Slide your chair under your desk so you are close enough to maintain good posture. When working at a computer, place your monitor at a height where you are looking straight ahead and you do not have to tilt your head forward or downward to look at the screen. Standing  When standing, keep your spine neutral and keep your feet about hip-width apart. Keep a slight bend in your knees. Your ears, shoulders, and hips should line up. When you do a task in which you stand in one place for a long time, place one foot up on a stable object that is 2-4 inches (5-10 cm) high, such as a footstool. This helps keep your spine neutral. Resting When lying down and resting, avoid positions that are most painful for you. Try to support your neck in a neutral position. You can use a contour pillow or a small rolled-up towel. Your pillow should support your neck but not push on it. This information is not intended to replace advice given to you by your health care provider. Make sure you discuss any questions you have with your health care provider. Document Revised: 03/20/2023 Document Reviewed: 06/06/2022 Elsevier Patient Education  2024 [employer].

## 2024-07-23 NOTE — Assessment & Plan Note (Signed)
 Tenderness over bilateral trapezius muscle.  Discussed likely muscle spasm after fall.  Politely declines shoulder and c spine Xrays today. Prefers to trial conservative therapy first.  Schedule Tylenol 1000 mg twice daily, start Flexeril  nightly

## 2024-07-25 NOTE — Assessment & Plan Note (Signed)
 Ulnar border tenderness on exam.  Pending x-ray.

## 2024-07-25 NOTE — Assessment & Plan Note (Deleted)
 Symptom unchanged.  Advised trial of Symbicort .  Close follow-up

## 2024-07-25 NOTE — Assessment & Plan Note (Signed)
 Symptom unchanged.  Advised trial of Symbicort  as before.  Close follow-up

## 2024-08-01 ENCOUNTER — Encounter: Payer: Self-pay | Admitting: Family

## 2024-08-05 ENCOUNTER — Ambulatory Visit: Payer: Self-pay | Admitting: Family

## 2024-08-11 ENCOUNTER — Telehealth: Admitting: Family Medicine

## 2024-08-11 DIAGNOSIS — U071 COVID-19: Secondary | ICD-10-CM

## 2024-08-11 MED ORDER — PREDNISONE 20 MG PO TABS: 20.0000 mg | ORAL_TABLET | Freq: Two times a day (BID) | ORAL | 0 refills | Status: AC

## 2024-08-11 MED ORDER — PROMETHAZINE-DM 6.25-15 MG/5ML PO SYRP: 5.0000 mL | ORAL_SOLUTION | Freq: Four times a day (QID) | ORAL | 0 refills | Status: AC | PRN

## 2024-08-11 NOTE — Patient Instructions (Signed)
 COVID-19: What to Know COVID-19 is an infection caused by a virus called SARS-CoV-2. This type of virus is called a coronavirus. People with COVID-19 may: Have few to no symptoms. Have mild to moderate symptoms that affect their lungs and breathing. Get very sick. What are the causes?  COVID-19 is caused by a virus. This virus may be in the air as droplets or on surfaces. It can spread from an infected person when they cough, sneeze, speak, sing, or breathe. You may become infected if: You breathe in the infected droplets in the air. You touch an object that has the virus on it. What increases the risk? You are at risk of getting COVID-19 if you have been around someone with the infection. You may be more likely to get very sick if: You are 51 years old or older. You have certain medical conditions, such as: Heart disease. Diabetes. Long-term respiratory disease. Cancer. Pregnancy. You are immunocompromised. This means your body can't fight infections easily. You have a disability that makes it hard for you to move around, you have trouble moving, or you can't move at all. What are the signs or symptoms? People may have different symptoms from COVID-19. The symptoms can also be mild to very bad. They often show up in 5-6 days after being infected. But, they can take up to 14 days to appear. Common symptoms are: Cough. Feeling tired. New loss of taste or smell. Fever. Less common symptoms are: Sore throat. Headache. Body or muscle aches. Diarrhea. A skin rash or fingers or toes that are a different color than usual. Red or irritated eyes. Sometimes, COVID-19 does not cause symptoms. How is this diagnosed? COVID-19 can be diagnosed with tests done in the lab or at home. Fluid from your nose, mouth, or lungs will be used to check for the virus. How is this treated? Treatment for COVID-19 depends on how sick you are. Mild symptoms can be treated at home with rest, fluids, and  over-the-counter medicines. very bad symptoms may be treated in a hospital intensive care unit (ICU). If you have symptoms and are at risk of getting very sick, you may be given a medicine that fights viruses. This medicine is called an antiviral. How is this prevented? To protect yourself from COVID-19: Know your risk factors. Get vaccinated. If your body can't fight infections easily, talk to your provider about treatment to help prevent COVID-19. Stay at least about 3 feet (1 meter) away from other people. Wear mask that fits well when: You can't stay at a distance from people. You're in a place with not a lot of air flow. Try to be in open spaces with good air flow when you are in public. Wash your hands often or use an alcohol-based hand sanitizer. Cover your nose and mouth when you cough or sneeze. If you think you have COVID-19 or have been around someone who has it, stay home and away from other people as told by your provider or health officials. Where to find more information To learn more: Go to [employer] Click Health Topics. Type COVID-19 in the search box. Go to [employer].br Click Health Topics. Then click All Topics. Type COVID-19 in the search box. Get help right away if: You have trouble breathing or get short of breath. You have pain or pressure in your chest. You're feeling confused. These symptoms may be an emergency. Get help right away. Call 911. Do not wait to see if the symptoms will go away.  Do not drive yourself to the hospital. This information is not intended to replace advice given to you by your health care provider. Make sure you discuss any questions you have with your health care provider. Document Revised: 08/17/2023 Document Reviewed: 08/09/2023 Elsevier Patient Education  2025 [employer].

## 2024-08-11 NOTE — Progress Notes (Signed)
 Virtual Visit Consent   Judy Simmons, you are scheduled for a virtual visit with a [facility] provider today. Just as with appointments in the office, your consent must be obtained to participate. Your consent will be active for this visit and any virtual visit you may have with one of our providers in the next 365 days. If you have a MyChart account, a copy of this consent can be sent to you electronically.  As this is a virtual visit, video technology does not allow for your provider to perform a traditional examination. This may limit your provider's ability to fully assess your condition. If your provider identifies any concerns that need to be evaluated in person or the need to arrange testing (such as labs, EKG, etc.), we will make arrangements to do so. Although advances in technology are sophisticated, we cannot ensure that it will always work on either your end or our end. If the connection with a video visit is poor, the visit may have to be switched to a telephone visit. With either a video or telephone visit, we are not always able to ensure that we have a secure connection.  By engaging in this virtual visit, you consent to the provision of healthcare and authorize for your insurance to be billed (if applicable) for the services provided during this visit. Depending on your insurance coverage, you may receive a charge related to this service.  I need to obtain your verbal consent now. Are you willing to proceed with your visit today? Judy Simmons has provided verbal consent on 08/11/2024 for a virtual visit (video or telephone). Judy Lamp, FNP  Date: 08/11/2024 11:17 AM   Virtual Visit via Video Note   I, Judy Simmons, connected with  Judy Simmons  ([id number], 06-09-1971) on 08/11/24 at 11:15 AM EDT by a video-enabled telemedicine application and verified that I am speaking with the correct person using two identifiers.  Location: Patient: Virtual Visit  Location Patient: Home Provider: Virtual Visit Location Provider: Home Office   I discussed the limitations of evaluation and management by telemedicine and the availability of in person appointments. The patient expressed understanding and agreed to proceed.    History of Present Illness: Judy Simmons is a 53 y.o. who identifies as a female who was assigned female at birth, and is being seen today for covid positive testing at home with family also having covid. Sinus pressure and congestion, cough, sx started yesterday. Judy Simmons  HPI: HPI  Problems:  Patient Active Problem List   Diagnosis Date Noted   Right wrist pain 07/23/2024   Neck pain 07/23/2024   Annual physical exam 03/15/2023   Body aches 11/12/2022   Shortness of breath 04/08/2022   Onychomycosis 06/14/2021   History of abnormal cervical Pap smear 05/06/2021   Anxiety and depression 05/06/2021   Chest wall pain 04/30/2021   Memory loss 04/30/2021   Morbid obesity with BMI of 45.0-49.9, adult (HCC) 11/02/2020   Arthritis, lumbar spine 11/02/2020   Arthritis of knee, right 11/02/2020   Gastroesophageal reflux disease with esophagitis without hemorrhage    Hiatal hernia    Stomach irritation    FH: colon cancer    Polyp of sigmoid colon    Polymerase chain reaction DNA test positive for herpes simplex virus type 1 (HSV-1) 03/23/2018   Overactive bladder 12/20/2017   Right elbow pain 12/18/2017   Acute bronchitis 11/27/2017   DDD (degenerative disc disease), lumbar 06/30/2017   Asthma 02/26/2017  Obesity 02/26/2017   LGSIL Pap smear of vagina 02/26/2017   Acute right-sided low back pain with right-sided sciatica 01/23/2017   Depression, recurrent (HCC) 01/23/2017    Allergies:  Allergies  Allergen Reactions   Nsaids Other (See Comments)    Gastric Sleeve surgery   Penicillin G Other (See Comments)    Rxn as a Child   Medications:  Current Outpatient Medications:    albuterol  (VENTOLIN  HFA) 108 (90 Base)  MCG/ACT inhaler, Inhale 1-2 puffs into the lungs every 6 (six) hours as needed for wheezing or shortness of breath., Disp: 18 g, Rfl: 2   budesonide -formoterol  (SYMBICORT ) 80-4.5 MCG/ACT inhaler, Inhale 2 puffs into the lungs 2 (two) times daily., Disp: 1 each, Rfl: 3   cetirizine  (ZYRTEC ) 10 MG tablet, Take 1 tablet (10 mg total) by mouth daily., Disp: 30 tablet, Rfl: 3   cyclobenzaprine  (FLEXERIL ) 5 MG tablet, Take 1-2 tablets (5-10 mg total) by mouth at bedtime as needed for muscle spasms., Disp: 30 tablet, Rfl: 1   diclofenac  Sodium (VOLTAREN ) 1 % GEL, Apply 4 g topically 4 (four) times daily. (Patient taking differently: Apply 4 g topically as needed.), Disp: 50 g, Rfl: 3   escitalopram  (LEXAPRO ) 10 MG tablet, Take 1 tablet (10 mg total) by mouth daily., Disp: 90 tablet, Rfl: 3   Tavaborole  5 % SOLN, Apply to toenails at bedtime, Disp: 10 mL, Rfl: 2   terbinafine  (LAMISIL ) 250 MG tablet, Take 1 tablet (250 mg total) by mouth daily. Take with food., Disp: 30 tablet, Rfl: 2  Observations/Objective: Patient is well-developed, well-nourished in no acute distress.  Resting comfortably  at home.  Head is normocephalic, atraumatic.  No labored breathing.  Speech is clear and coherent with logical content.  Patient is alert and oriented at baseline.    Assessment and Plan: 1. COVID (Primary)  Increase fluids, humidifier at night, mvi with vit d and zinc, UC if sx persist or worsen.   Follow Up Instructions: I discussed the assessment and treatment plan with the patient. The patient was provided an opportunity to ask questions and all were answered. The patient agreed with the plan and demonstrated an understanding of the instructions.  A copy of instructions were sent to the patient via MyChart unless otherwise noted below.     The patient was advised to call back or seek an in-person evaluation if the symptoms worsen or if the condition fails to improve as anticipated.    Judy Bouldin,  FNP

## 2024-09-04 ENCOUNTER — Encounter: Payer: Self-pay | Admitting: Obstetrics and Gynecology

## 2024-09-23 ENCOUNTER — Ambulatory Visit: Admitting: Family

## 2024-10-03 ENCOUNTER — Ambulatory Visit: Admitting: Obstetrics and Gynecology

## 2024-10-03 ENCOUNTER — Encounter: Payer: Self-pay | Admitting: Obstetrics and Gynecology

## 2024-10-03 VITALS — BP 123/80 | HR 94 | Ht 65.0 in | Wt 277.0 lb

## 2024-10-03 DIAGNOSIS — F418 Other specified anxiety disorders: Secondary | ICD-10-CM | POA: Diagnosis not present

## 2024-10-03 DIAGNOSIS — Z1211 Encounter for screening for malignant neoplasm of colon: Secondary | ICD-10-CM

## 2024-10-03 DIAGNOSIS — Z90710 Acquired absence of both cervix and uterus: Secondary | ICD-10-CM

## 2024-10-03 DIAGNOSIS — F4321 Adjustment disorder with depressed mood: Secondary | ICD-10-CM | POA: Diagnosis not present

## 2024-10-03 DIAGNOSIS — Z1231 Encounter for screening mammogram for malignant neoplasm of breast: Secondary | ICD-10-CM

## 2024-10-03 DIAGNOSIS — F32A Depression, unspecified: Secondary | ICD-10-CM

## 2024-10-03 DIAGNOSIS — Z01419 Encounter for gynecological examination (general) (routine) without abnormal findings: Secondary | ICD-10-CM | POA: Diagnosis not present

## 2024-10-03 MED ORDER — ESCITALOPRAM OXALATE 10 MG PO TABS: 10.0000 mg | ORAL_TABLET | Freq: Every day | ORAL | 3 refills | Status: AC

## 2024-10-03 NOTE — Patient Instructions (Signed)
 I value your feedback and you entrusting [place] with your care. If you get a [facility] patient survey, I would appreciate you taking the time to let us know about your experience today. Thank you!  [facility] ([place])--[phone]

## 2024-10-03 NOTE — Progress Notes (Signed)
 PCP:  Dineen Rollene MATSU, FNP   Chief Complaint  Patient presents with   Annual Exam    HPI:      Judy Simmons is a 53 y.o. G1P1001 who LMP was No LMP recorded. Patient has had a hysterectomy., presents today for her annual examination.  Her menses are absent due to [facility] 2013 for uterine prolapse with pubovaginal sling.  Dysmenorrhea none. She does not have postmenopausal bleeding. No longer having VS sx, Rx'd ERT last yr after labs confirmed menopause but didn't use it.   Sex activity: not currently sexually active. No vag sx.  Last Pap: 06/30/22  Results were neg cells/neg HPV DNA; 3 yr f/u recommended (although pt s/p hyst); 05/06/21 Results were ASCUS/positive HPV DNA.  02/17/17 Results were: no abnormalities /neg HPV DNA; s/p LGSIL 2015 with HPV on bx.  Hx of STDs: HPV; wants STD testing today  Hx of vaginal lesion 2018 with neg HSV 2 IgG several times, last one 7/20. Had pos HSV 1/2 combo IgM.   Last mammogram: 06/24/21 Results were normal, repeat in 12 months  There is no FH of breast cancer. There is no FH of ovarian cancer. The patient does not do self-breast exams.  Tobacco use: few cigs daily Alcohol use: none No drug use.  Exercise: not active  Colonoscopy:  10/20 with polyp; repeat due after 5 yrs; FH colon cancer in her dad, genetic testing not done for pt  She does get adequate calcium but not Vitamin D  in her diet. Labs with PCP with Vit D deficiency. Bad about taking Vit D supp.   On lexapro  10 mg for anxiety/depression since 3/24 after loss of Father with sx control, but pt bad about taking it regularly so gets sx. Wants to continue.   Past Medical History:  Diagnosis Date   Asthma    Atypical mole 02/27/2024   Right Posterior Flank, moderate to severe atypia,05/07/25shave removal margins free   Condyloma acuminatum    Depression    Dysplastic nevus 07/03/2024   R spinal lower back, mild atypia   GERD (gastroesophageal reflux disease)    LGSIL on  Pap smear of cervix    Obesity     Past Surgical History:  Procedure Laterality Date   COLONOSCOPY WITH PROPOFOL  N/A 09/11/2019   Procedure: COLONOSCOPY WITH PROPOFOL ;  Surgeon: Janalyn Keene NOVAK, MD;  Location: [facility] ENDOSCOPY;  Service: Endoscopy;  Laterality: N/A;   ESOPHAGOGASTRODUODENOSCOPY (EGD) WITH PROPOFOL  N/A 09/11/2019   Procedure: ESOPHAGOGASTRODUODENOSCOPY (EGD) WITH PROPOFOL ;  Surgeon: Janalyn Keene NOVAK, MD;  Location: [facility] ENDOSCOPY;  Service: Endoscopy;  Laterality: N/A;   HYSTEROSCOPY WITH D & C  04/26/2011   simple hyperplasia, endometrial polyp   LAPAROSCOPIC GASTRIC SLEEVE RESECTION  2012   PUBOVAGINAL SLING  04/03/2012   using TVTdevice   TONSILLECTOMY AND ADENOIDECTOMY  2000   TOTAL VAGINAL HYSTERECTOMY  04/03/2012   uterine prolapse, she has a CERVIX    Family History  Problem Relation Age of Onset   Diabetes Father    Colon cancer Father 40   Alcohol abuse Daughter    Drug abuse Daughter     Social History   Socioeconomic History   Marital status: Single    Spouse name: Not on file   Number of children: 1   Years of education: 14   Highest education level: Not on file  Occupational History   Occupation: customer service  Tobacco Use   Smoking status: Every Day  Current packs/day: 0.50    Types: Cigarettes   Smokeless tobacco: Never  Vaping Use   Vaping status: Never Used  Substance and Sexual Activity   Alcohol use: Not Currently   Drug use: No   Sexual activity: Not Currently    Birth control/protection: Surgical    Comment: Hysterectomy   Other Topics Concern   Not on file  Social History Narrative   Lives in [place]      Works- insurance and retail      Living with parents      One daughter who has grandson ( 8 years) , granddaughter  6, and then 13 month grandson.       Social Drivers of Corporate Investment Banker Strain: Not on file  Food Insecurity: Not on file  Transportation Needs: Not on file  Physical  Activity: Not on file  Stress: Not on file  Social Connections: Not on file  Intimate Partner Violence: Not on file    Outpatient Medications Prior to Visit  Medication Sig Dispense Refill   albuterol  (VENTOLIN  HFA) 108 (90 Base) MCG/ACT inhaler Inhale 1-2 puffs into the lungs every 6 (six) hours as needed for wheezing or shortness of breath. 18 g 2   budesonide -formoterol  (SYMBICORT ) 80-4.5 MCG/ACT inhaler Inhale 2 puffs into the lungs 2 (two) times daily. 1 each 3   cetirizine  (ZYRTEC ) 10 MG tablet Take 1 tablet (10 mg total) by mouth daily. 30 tablet 3   cyclobenzaprine  (FLEXERIL ) 5 MG tablet Take 1-2 tablets (5-10 mg total) by mouth at bedtime as needed for muscle spasms. 30 tablet 1   promethazine -dextromethorphan (PROMETHAZINE -DM) 6.25-15 MG/5ML syrup Take 5 mLs by mouth 4 (four) times daily as needed for cough. 118 mL 0   Tavaborole  5 % SOLN Apply to toenails at bedtime 10 mL 2   terbinafine  (LAMISIL ) 250 MG tablet Take 1 tablet (250 mg total) by mouth daily. Take with food. 30 tablet 2   escitalopram  (LEXAPRO ) 10 MG tablet Take 1 tablet (10 mg total) by mouth daily. 90 tablet 3   diclofenac  Sodium (VOLTAREN ) 1 % GEL Apply 4 g topically 4 (four) times daily. (Patient taking differently: Apply 4 g topically as needed.) 50 g 3   No facility-administered medications prior to visit.      ROS:  Review of Systems  Constitutional:  Negative for fatigue, fever and unexpected weight change.  Respiratory:  Negative for cough, shortness of breath and wheezing.   Cardiovascular:  Negative for chest pain, palpitations and leg swelling.  Gastrointestinal:  Negative for blood in stool, constipation, diarrhea, nausea and vomiting.  Endocrine: Negative for cold intolerance, heat intolerance and polyuria.  Genitourinary:  Negative for dyspareunia, dysuria, flank pain, frequency, genital sores, hematuria, menstrual problem, pelvic pain, urgency, vaginal bleeding, vaginal discharge and vaginal  pain.  Musculoskeletal:  Negative for back pain, joint swelling and myalgias.  Skin:  Negative for rash.  Neurological:  Negative for dizziness, syncope, light-headedness, numbness and headaches.  Hematological:  Negative for adenopathy.  Psychiatric/Behavioral:  Negative for agitation, confusion, dysphoric mood, sleep disturbance and suicidal ideas. The patient is not nervous/anxious.   BREAST: No symptoms   Objective: BP 123/80   Pulse 94   Ht 5' 5 (1.651 m)   Wt 277 lb (125.6 kg)   BMI 46.10 kg/m    Physical Exam Constitutional:      Appearance: She is well-developed.  Genitourinary:     Vulva normal.     Genitourinary Comments: UTERUS/CX SURG  REM     Right Labia: No rash, tenderness or lesions.    Left Labia: No tenderness, lesions or rash.    Vaginal cuff intact.    No vaginal discharge, erythema or tenderness.      Right Adnexa: not tender and no mass present.    Left Adnexa: not tender and no mass present.    Cervix is absent.     Uterus is absent.  Breasts:    Right: No mass, nipple discharge, skin change or tenderness.     Left: No mass, nipple discharge, skin change or tenderness.  Neck:     Thyroid : No thyromegaly.  Cardiovascular:     Rate and Rhythm: Normal rate and regular rhythm.     Heart sounds: Normal heart sounds. No murmur heard. Pulmonary:     Effort: Pulmonary effort is normal.     Breath sounds: Normal breath sounds.  Abdominal:     Palpations: Abdomen is soft.     Tenderness: There is no abdominal tenderness. There is no guarding.  Musculoskeletal:        General: Normal range of motion.     Cervical back: Normal range of motion.  Neurological:     General: No focal deficit present.     Mental Status: She is alert and oriented to person, place, and time.     Cranial Nerves: No cranial nerve deficit.  Skin:    General: Skin is warm and dry.  Psychiatric:        Mood and Affect: Mood normal.        Behavior: Behavior normal.         Thought Content: Thought content normal.        Judgment: Judgment normal.  Vitals reviewed.     Assessment/Plan: Encounter for annual routine gynecological examination  Encounter for screening mammogram for malignant neoplasm of breast - Plan: MM 3D SCREENING MAMMOGRAM BILATERAL BREAST; pt to schedule mammo  Screening for colon cancer - Plan: Ambulatory referral to Gastroenterology  Anxiety and depression - Plan: escitalopram  (LEXAPRO ) 10 MG tablet; Rx RF eRxd. Recommended taking daily.   Grief - Plan: escitalopram  (LEXAPRO ) 10 MG tablet         Meds ordered this encounter  Medications   escitalopram  (LEXAPRO ) 10 MG tablet    Sig: Take 1 tablet (10 mg total) by mouth daily.    Dispense:  90 tablet    Refill:  3    Supervising Provider:   ROBY, MICIA [[id number]]    GYN counsel breast self exam, mammography screening, adequate intake of calcium and vitamin D , diet and exercise     F/U  Return in about 1 year (around 10/03/2025).  Judy Clavin B. Sachit Gilman, PA-C 10/03/2024 4:00 PM

## 2024-10-07 ENCOUNTER — Ambulatory Visit: Payer: Self-pay

## 2024-10-07 ENCOUNTER — Encounter: Payer: Self-pay | Admitting: Family

## 2024-10-07 NOTE — Telephone Encounter (Signed)
 FYI Only or Action Required?: FYI only for provider: ED advised and patient unable to go to to [facility] like to have an appointment.  Patient was last seen in primary care on 08/11/2024 by Blair, Diane W, FNP.  Called Nurse Triage reporting Dizziness.  Symptoms began several days ago.  Interventions attempted: Rest, hydration, or home remedies.  Symptoms are: unchanged.  Triage Disposition: Go to ED Now (or PCP Triage)  Patient/caregiver understands and will follow disposition?: No, refuses disposition  Copied from CRM [id number]. Topic: Clinical - Red Word Triage >> Oct 07, 2024 11:03 AM Frederich PARAS wrote: [facility] that prompted transfer to Nurse Triage: light head, blinking out.  pt reached out to pcp about symptoms,  light headed, episodes last week, more mild episodes, blink out, then memory  prob lem before and after blink out. last friday was last episode. Reason for Disposition  Patient sounds very sick or weak to the triager  Answer Assessment - Initial Assessment Questions Patient reports episodes of feeling like she was going to pass out but then would start to stare off into space. Patient was told by co workers that she got very pale and would just stare off into space. No LOC. Patient reports having memory concerns when these episodes have happened. Patient states last episode happened on Friday and she has no recollection of Friday. She knows her boss got her home but doesn't remember anything else. Patient reports this has happened before during periods of stress. Patient would like to be seen. Did recommend patient to the ED but patient refused at this time. Needs a call back from office.   1. DESCRIPTION: Describe your dizziness.     Patient reports having episodes of feeling like she was going to pass out but then would start to stare off into space. Patient reports some memory concerns.which last a couple of minutes.  2. LIGHTHEADED: Do you feel lightheaded? (e.g.,  somewhat faint, woozy, weak upon standing)     Currently not having lightheadedness 3. VERTIGO: Do you feel like either you or the room is spinning or tilting? (i.e., vertigo)     no 4. SEVERITY: How bad is it?  Do you feel like you are going to faint? Can you stand and walk?     Moderate to severe when the episodes happened.  5. ONSET:  When did the dizziness begin?     Occurred on Thursday and Friday 6. AGGRAVATING FACTORS: Does anything make it worse? (e.g., standing, change in head position)     no 7. HEART RATE: Can you tell me your heart rate? How many beats in 15 seconds?  (Note: Not all patients can do this.)       NA 8. CAUSE: What do you think is causing the dizziness? (e.g., decreased fluids or food, diarrhea, emotional distress, heat exposure, new medicine, sudden standing, vomiting; unknown)     Unsure of what caused it 9. RECURRENT SYMPTOM: Have you had dizziness before? If Yes, ask: When was the last time? What happened that time?     yes 10. OTHER SYMPTOMS: Do you have any other symptoms? (e.g., fever, chest pain, vomiting, diarrhea, bleeding)       no  Protocols used: Dizziness - Lightheadedness-A-AH

## 2024-10-08 ENCOUNTER — Telehealth: Payer: Self-pay

## 2024-10-08 ENCOUNTER — Other Ambulatory Visit: Payer: Self-pay

## 2024-10-08 ENCOUNTER — Ambulatory Visit
Admission: RE | Admit: 2024-10-08 | Discharge: 2024-10-08 | Disposition: A | Discharge status: Home / Self Care | Attending: Emergency Medicine | Admitting: Emergency Medicine

## 2024-10-08 VITALS — BP 130/82 | HR 67 | Temp 97.6°F | Resp 20

## 2024-10-08 DIAGNOSIS — R55 Syncope and collapse: Secondary | ICD-10-CM | POA: Diagnosis not present

## 2024-10-08 DIAGNOSIS — R42 Dizziness and giddiness: Secondary | ICD-10-CM | POA: Insufficient documentation

## 2024-10-08 DIAGNOSIS — Z800 Family history of malignant neoplasm of digestive organs: Secondary | ICD-10-CM

## 2024-10-08 DIAGNOSIS — Z1211 Encounter for screening for malignant neoplasm of colon: Secondary | ICD-10-CM

## 2024-10-08 DIAGNOSIS — Z860100 Personal history of colon polyps, unspecified: Secondary | ICD-10-CM

## 2024-10-08 MED ORDER — NA SULFATE-K SULFATE-MG SULF 17.5-3.13-1.6 GM/177ML PO SOLN: 1.0000 | Freq: Once | ORAL | 0 refills | Status: AC

## 2024-10-08 NOTE — ED Triage Notes (Addendum)
 Patient to Urgent Care with complaints of dizziness/ lightheadedness/ near syncope/ disorientation. Episodes on Thursday and Friday. Denies any current symptoms.   Reports one episode was witnessed by her coworkers who described she looked pale and was staring off into space.   No LOC.  Reports increased stress/ poor sleep.

## 2024-10-08 NOTE — Telephone Encounter (Signed)
 Gastroenterology Pre-Procedure Review  Request Date: 01/21/25 Requesting Physician: Dr. Melany  PATIENT REVIEW QUESTIONS: The patient responded to the following health history questions as indicated:    1. Are you having any GI issues? no 2. Do you have a personal history of Polyps? yes (last colonoscopy performed by Dr. Janalyn 09/11/2019 recommended repeat in 5 years) 3. Do you have a family history of Colon Cancer or Polyps? yes (father colon cancer) 4. Diabetes Mellitus? no 5. Joint replacements in the past 12 months?no 6. Major health problems in the past 3 months?no 7. Any artificial heart valves, MVP, or defibrillator?no    MEDICATIONS & ALLERGIES:    Patient reports the following regarding taking any anticoagulation/antiplatelet therapy:   Plavix, Coumadin, Eliquis, Xarelto, Lovenox, Pradaxa, Brilinta, or Effient? no Aspirin? no  Patient confirms/reports the following medications:  Current Outpatient Medications  Medication Sig Dispense Refill   Na Sulfate-K Sulfate-Mg Sulfate concentrate (SUPREP) 17.5-3.13-1.6 GM/177ML SOLN Take 1 kit (354 mLs total) by mouth once for 1 dose. 354 mL 0   albuterol  (VENTOLIN  HFA) 108 (90 Base) MCG/ACT inhaler Inhale 1-2 puffs into the lungs every 6 (six) hours as needed for wheezing or shortness of breath. 18 g 2   budesonide -formoterol  (SYMBICORT ) 80-4.5 MCG/ACT inhaler Inhale 2 puffs into the lungs 2 (two) times daily. 1 each 3   cetirizine  (ZYRTEC ) 10 MG tablet Take 1 tablet (10 mg total) by mouth daily. 30 tablet 3   cyclobenzaprine  (FLEXERIL ) 5 MG tablet Take 1-2 tablets (5-10 mg total) by mouth at bedtime as needed for muscle spasms. 30 tablet 1   diclofenac  Sodium (VOLTAREN ) 1 % GEL Apply 4 g topically 4 (four) times daily. (Patient taking differently: Apply 4 g topically as needed.) 50 g 3   escitalopram  (LEXAPRO ) 10 MG tablet Take 1 tablet (10 mg total) by mouth daily. 90 tablet 3   promethazine -dextromethorphan (PROMETHAZINE -DM)  6.25-15 MG/5ML syrup Take 5 mLs by mouth 4 (four) times daily as needed for cough. 118 mL 0   Tavaborole  5 % SOLN Apply to toenails at bedtime 10 mL 2   terbinafine  (LAMISIL ) 250 MG tablet Take 1 tablet (250 mg total) by mouth daily. Take with food. 30 tablet 2   No current facility-administered medications for this visit.    Patient confirms/reports the following allergies:  Allergies  Allergen Reactions   Nsaids Other (See Comments)    Gastric Sleeve surgery   Penicillin G Other (See Comments)    Rxn as a Child    No orders of the defined types were placed in this encounter.   AUTHORIZATION INFORMATION Primary Insurance: 1D#: Group #:  Secondary Insurance: 1D#: Group #:  SCHEDULE INFORMATION: Date: 01/21/25 Time: Location: [facility]

## 2024-10-08 NOTE — Telephone Encounter (Signed)
 noted

## 2024-10-08 NOTE — Discharge Instructions (Addendum)
 Follow up with your primary care provider tomorrow.    Go to the emergency department right away if your symptoms return.

## 2024-10-08 NOTE — ED Provider Notes (Signed)
 Judy Simmons    CSN: [id number] Arrival date & time: 10/08/24  1813      History   Chief Complaint Chief Complaint  Patient presents with   Dizziness    I've had a couple of episodes of light headedness in the past few days that leave me briefly disoriented. Did a teledoc that recommended ER or UC, as my Dr is booked through 11/25. - Entered by patient    HPI [name] is a 53 y.o. female.  Patient presents with 2 episodes of dizziness, lightheadedness, near syncope, disorientation, inability to remember events.  She did not lose consciousness.  The episodes occurred once on 10/03/2024 and once on 10/04/2024.  One of the episodes was witnessed by her coworkers and they reported that she was pale with a blank stare.  She states her boss took her home but she only has vague memory of getting home.  She has not had any symptoms since 10/04/2024.  Currently she denies dizziness, weakness, numbness, vision change, headache, chest pain, shortness of breath, nausea, vomiting.  At the time of the episodes, she was under tremendous stress at home due to a family situation and she had had very little sleep.  She contacted her PCPs office to schedule an appointment yesterday and was instructed to go to the emergency department which she declined to do; because she would not go to the emergency department, patient advised to come to urgent care.  Patient reports history of similar episode a few years ago when she was also under tremendous stress.    The history is provided by the patient and medical records.    Past Medical History:  Diagnosis Date   Asthma    Atypical mole 02/27/2024   Right Posterior Flank, moderate to severe atypia,05/07/25shave removal margins free   Condyloma acuminatum    Depression    Dysplastic nevus 07/03/2024   R spinal lower back, mild atypia   GERD (gastroesophageal reflux disease)    LGSIL on Pap smear of cervix    Obesity     Patient Active  Problem List   Diagnosis Date Noted   Right wrist pain 07/23/2024   Neck pain 07/23/2024   Annual physical exam 03/15/2023   Body aches 11/12/2022   Shortness of breath 04/08/2022   Onychomycosis 06/14/2021   History of abnormal cervical Pap smear 05/06/2021   Anxiety and depression 05/06/2021   Chest wall pain 04/30/2021   Memory loss 04/30/2021   Morbid obesity with BMI of 45.0-49.9, adult (HCC) 11/02/2020   Arthritis, lumbar spine 11/02/2020   Arthritis of knee, right 11/02/2020   Gastroesophageal reflux disease with esophagitis without hemorrhage    Hiatal hernia    Stomach irritation    FH: colon cancer    Polyp of sigmoid colon    Polymerase chain reaction DNA test positive for herpes simplex virus type 1 (HSV-1) 03/23/2018   Overactive bladder 12/20/2017   Right elbow pain 12/18/2017   Acute bronchitis 11/27/2017   DDD (degenerative disc disease), lumbar 06/30/2017   Asthma 02/26/2017   Obesity 02/26/2017   LGSIL Pap smear of vagina 02/26/2017   Acute right-sided low back pain with right-sided sciatica 01/23/2017   Depression, recurrent 01/23/2017    Past Surgical History:  Procedure Laterality Date   COLONOSCOPY WITH PROPOFOL  N/A 09/11/2019   Procedure: COLONOSCOPY WITH PROPOFOL ;  Surgeon: Janalyn Keene NOVAK, MD;  Location: [facility] ENDOSCOPY;  Service: Endoscopy;  Laterality: N/A;   ESOPHAGOGASTRODUODENOSCOPY (EGD) WITH PROPOFOL  N/A  09/11/2019   Procedure: ESOPHAGOGASTRODUODENOSCOPY (EGD) WITH PROPOFOL ;  Surgeon: Janalyn Keene NOVAK, MD;  Location: [facility] ENDOSCOPY;  Service: Endoscopy;  Laterality: N/A;   HYSTEROSCOPY WITH D & C  04/26/2011   simple hyperplasia, endometrial polyp   LAPAROSCOPIC GASTRIC SLEEVE RESECTION  2012   PUBOVAGINAL SLING  04/03/2012   using TVTdevice   TONSILLECTOMY AND ADENOIDECTOMY  2000   TOTAL VAGINAL HYSTERECTOMY  04/03/2012   uterine prolapse, she has a CERVIX    OB History     Gravida  1   Para  1   Term  1   Preterm       AB      Living  1      SAB      IAB      Ectopic      Multiple      Live Births  1            Home Medications    Prior to Admission medications   Medication Sig Start Date End Date Taking? Authorizing Provider  albuterol  (VENTOLIN  HFA) 108 (90 Base) MCG/ACT inhaler Inhale 1-2 puffs into the lungs every 6 (six) hours as needed for wheezing or shortness of breath. 05/07/24   Dineen Rollene MATSU, FNP  budesonide -formoterol  (SYMBICORT ) 80-4.5 MCG/ACT inhaler Inhale 2 puffs into the lungs 2 (two) times daily. 06/06/24   Dineen Rollene MATSU, FNP  cetirizine  (ZYRTEC ) 10 MG tablet Take 1 tablet (10 mg total) by mouth daily. 03/04/21   Dineen Rollene MATSU, FNP  cyclobenzaprine  (FLEXERIL ) 5 MG tablet Take 1-2 tablets (5-10 mg total) by mouth at bedtime as needed for muscle spasms. 07/23/24   Dineen Rollene MATSU, FNP  diclofenac  Sodium (VOLTAREN ) 1 % GEL Apply 4 g topically 4 (four) times daily. Patient taking differently: Apply 4 g topically as needed. 04/30/21   Dineen Rollene MATSU, FNP  escitalopram  (LEXAPRO ) 10 MG tablet Take 1 tablet (10 mg total) by mouth daily. 10/03/24   Copland, Alicia B, PA-C  Na Sulfate-K Sulfate-Mg Sulfate concentrate (SUPREP) 17.5-3.13-1.6 GM/177ML SOLN Take 1 kit (354 mLs total) by mouth once for 1 dose. 10/08/24 10/08/24  Jinny Carmine, MD  promethazine -dextromethorphan (PROMETHAZINE -DM) 6.25-15 MG/5ML syrup Take 5 mLs by mouth 4 (four) times daily as needed for cough. Patient not taking: Reported on 10/08/2024 08/11/24   Blair, Diane W, FNP  Tavaborole  5 % SOLN Apply to toenails at bedtime 02/27/24   Jackquline Sawyer, MD  terbinafine  (LAMISIL ) 250 MG tablet Take 1 tablet (250 mg total) by mouth daily. Take with food. 06/18/24   Jackquline Sawyer, MD    Family History Family History  Problem Relation Age of Onset   Diabetes Father    Colon cancer Father 75   Alcohol abuse Daughter    Drug abuse Daughter     Social History Social History   Tobacco Use   Smoking  status: Every Day    Current packs/day: 0.50    Types: Cigarettes   Smokeless tobacco: Never  Vaping Use   Vaping status: Never Used  Substance Use Topics   Alcohol use: Not Currently   Drug use: No     Allergies   Nsaids and Penicillin g   Review of Systems Review of Systems  Constitutional:  Negative for chills and fever.  Respiratory:  Negative for cough and shortness of breath.   Cardiovascular:  Negative for chest pain and palpitations.  Gastrointestinal:  Negative for nausea and vomiting.  Neurological:  Positive for dizziness and light-headedness.  Negative for syncope, speech difficulty, weakness, numbness and headaches.     Physical Exam Triage Vital Signs ED Triage Vitals [10/08/24 1843]  Encounter Vitals Group     BP      Girls Systolic BP Percentile      Girls Diastolic BP Percentile      Boys Systolic BP Percentile      Boys Diastolic BP Percentile      Pulse      Resp      Temp      Temp src      SpO2      Weight      Height      Head Circumference      Peak Flow      Pain Score 0     Pain Loc      Pain Education      Exclude from Growth Chart    No data found.  Updated Vital Signs BP 130/82   Pulse 67   Temp 97.6 F (36.4 C)   Resp 20   SpO2 99%   Visual Acuity Right Eye Distance:   Left Eye Distance:   Bilateral Distance:    Right Eye Near:   Left Eye Near:    Bilateral Near:     Physical Exam Constitutional:      General: She is not in acute distress.    Appearance: She is obese.  HENT:     Mouth/Throat:     Mouth: Mucous membranes are moist.  Eyes:     Extraocular Movements: Extraocular movements intact.     Pupils: Pupils are equal, round, and reactive to light.  Cardiovascular:     Rate and Rhythm: Normal rate and regular rhythm.     Heart sounds: Normal heart sounds.  Pulmonary:     Effort: Pulmonary effort is normal. No respiratory distress.     Breath sounds: Normal breath sounds.  Skin:    General: Skin is  warm and dry.  Neurological:     General: No focal deficit present.     Mental Status: She is alert and oriented to person, place, and time.     Cranial Nerves: No cranial nerve deficit.     Sensory: No sensory deficit.     Motor: No weakness.     Gait: Gait normal.      UC Treatments / Results  Labs (all labs ordered are listed, but only abnormal results are displayed) Labs Reviewed - No data to display  EKG   Radiology No results found.  Procedures Procedures (including critical care time)  Medications Ordered in UC Medications - No data to display  Initial Impression / Assessment and Plan / UC Course  I have reviewed the triage vital signs and the nursing notes.  Pertinent labs & imaging results that were available during my care of the patient were reviewed by me and considered in my medical decision making (see chart for details).   Near syncope, dizziness.  Afebrile and vital signs are stable.  EKG shows sinus rhythm, rate 79, no ST elevation, compared to previous from 04/30/2021.  Discussed limitations of evaluation of her symptoms in an urgent care setting.  Patient declines transfer to the ED.  Discussed risks of not being evaluated in the emergency department, including stroke, other cardiovascular event, or even death.  Patient states she understands but, because she has not had symptoms in several days, she declines transfer to the ED at this time.  She  does agree that if her symptoms return she will go to the ED right away and she agrees to follow-up with her PCP tomorrow.  Education provided on near-syncope and dizziness.  Patient agrees to plan of care.  Final Clinical Impressions(s) / UC Diagnoses   Final diagnoses:  Near syncope  Dizziness     Discharge Instructions      Follow up with your primary care provider tomorrow.    Go to the emergency department right away if your symptoms return.        ED Prescriptions   None    PDMP not reviewed  this encounter.   Corlis Burnard DEL, NP 10/08/24 1925

## 2024-10-08 NOTE — Telephone Encounter (Signed)
 Pt is scheduled at [facility] today @ 6:15 pm

## 2024-10-09 NOTE — Telephone Encounter (Signed)
 Pt has been scheduled for 10/16/24 for f/up

## 2024-10-09 NOTE — Telephone Encounter (Signed)
 Follow up has been scheduled for 10/16/24 with you

## 2024-10-16 ENCOUNTER — Encounter: Payer: Self-pay | Admitting: Family

## 2024-10-16 ENCOUNTER — Telehealth: Payer: Self-pay | Admitting: Family

## 2024-10-16 ENCOUNTER — Ambulatory Visit: Admitting: Family

## 2024-10-16 ENCOUNTER — Encounter: Payer: Self-pay | Admitting: Obstetrics and Gynecology

## 2024-10-16 VITALS — BP 126/68 | HR 74 | Temp 97.9°F | Ht 65.0 in | Wt 267.4 lb

## 2024-10-16 DIAGNOSIS — F419 Anxiety disorder, unspecified: Secondary | ICD-10-CM

## 2024-10-16 DIAGNOSIS — F4321 Adjustment disorder with depressed mood: Secondary | ICD-10-CM | POA: Diagnosis not present

## 2024-10-16 DIAGNOSIS — R42 Dizziness and giddiness: Secondary | ICD-10-CM | POA: Diagnosis not present

## 2024-10-16 DIAGNOSIS — Z23 Encounter for immunization: Secondary | ICD-10-CM

## 2024-10-16 DIAGNOSIS — H5213 Myopia, bilateral: Secondary | ICD-10-CM | POA: Diagnosis not present

## 2024-10-16 DIAGNOSIS — F32A Depression, unspecified: Secondary | ICD-10-CM

## 2024-10-16 MED ORDER — ALPRAZOLAM 0.5 MG PO TABS: 0.5000 mg | ORAL_TABLET | Freq: Once | ORAL | 0 refills | Status: AC

## 2024-10-16 NOTE — Assessment & Plan Note (Addendum)
 2 episodes without recurrence.   discussed stress surrounding both episodes.  She is not orthostatic ; see flow sheet. Reassuring neurologic exam.  Pending MRI of the brain.  Provided Xanax to take ahead of MRI brain due to potential for claustrophobia. Call out to confirm with GYN that safe to have MRI with bladder sling.  Discussed echocardiogram, she politely declines at this time. Close follow up.

## 2024-10-16 NOTE — Patient Instructions (Addendum)
 We are calling [facility] to confirm that a pelvic sling is not a contraindication to MRI of the brain.  You may also send her a MyChart message.  Please take out all piercings prior to MRI as it is a magnet  Pending MRI of the brain  Let us  know if you dont hear back within 2 weeks in regards to an appointment being scheduled.   So that you are aware, if you are [facility] MyChart user , please pay attention to your MyChart messages as you may receive a MyChart message with a phone number to call and schedule this test/appointment own your own from our referral coordinator. This is a new process so I do not want you to miss this message.  If you are not a MyChart user, you will receive a phone call.

## 2024-10-16 NOTE — Progress Notes (Signed)
 Assessment & Plan:  Dizziness Assessment & Plan: 2 episodes without recurrence.   discussed stress surrounding both episodes.  She is not orthostatic ; see flow sheet. Reassuring neurologic exam.  Pending MRI of the brain.  Provided Xanax  to take ahead of MRI brain due to potential for claustrophobia. Call out to confirm with GYN that safe to have MRI with bladder sling.  Discussed echocardiogram, she politely declines at this time. Close follow up.   Orders: -     MR BRAIN W WO CONTRAST; Future -     ALPRAZolam ; Take 1 tablet (0.5 mg total) by mouth once for 1 dose. Take 15 - 30 minutes prior to appointment time.  Dispense: 2 tablet; Refill: 0 -     CBC with Differential/Platelet -     TSH -     Comprehensive metabolic panel with GFR  Anxiety and depression  Grief  Need for influenza vaccination -     Flu vaccine trivalent PF, 6mos and older(Flulaval,Afluria,Fluarix,Fluzone)     Return precautions given.   Risks, benefits, and alternatives of the medications and treatment plan prescribed today were discussed, and patient expressed understanding.   Education regarding symptom management and diagnosis given to patient on AVS either electronically or printed.  Return in about 1 month (around 11/15/2024).  [name], FNP  Subjective:    Patient ID: Judy Simmons, female    DOB: 06/10/1971, 53 y.o.   MRN: [id number]  CC: Judy Simmons is a 53 y.o. female who presents today for follow up.   HPI: HPI Discussed the use of AI scribe software for clinical note transcription with the patient, who gave verbal consent to proceed.  History of Present Illness   Judy Simmons is a 53 year old female who presents with episodes of dizziness.  She experienced two episodes of dizziness on November 6th and 7th. The first episode occurred around midnight on the 6th while standing and talking to her mother after a stressful evening involving her  grandson's ER visit. She felt 'wooziness' and an overwhelming sensation of potentially fainting, but did not fall. The episode resolved within a few seconds without intervention. The second episode occurred the following day at work while she was sitting and laughing. She felt lightheaded and appeared to 'glaze over,' as noted by a coworker. No urinary incontinence, associated vertigo, diaphoresis, vision loss, ha, nausea, cp, palpitations, syncope. She states that confusion, facial drooping, garbled speech not reported by family, co workers. Her boss drove her home after the incident.  She recalls a similar episode a couple of years ago while driving, which also occurred during a period of high stress. She associates these episodes with stress and fatigue, noting significant stressors at home, including her grandson's recent ER visit for psychiatric reasons and her six-year-old's ER visit for a UTI and bowel blockage.   She feels well today.  No recurrence of dizziness  She smokes cigarettes and consumes 2-3 cups of coffee daily. She occasionally uses liquid IV for hydration.  No drugs, alcohol use.    No h/o CVA.   Follow up dizziness wo LOC evaluated at [facility] 10/08/24 Episodes occurred 10/03/2024, 10/04/2024 EKG obtained  No prior neuroimaging  Allergies: Nsaids and Penicillin g Current Outpatient Medications on File Prior to Visit  Medication Sig Dispense Refill   albuterol  (VENTOLIN  HFA) 108 (90 Base) MCG/ACT inhaler Inhale 1-2 puffs into the lungs every 6 (six) hours as needed for wheezing or shortness of breath.  18 g 2   budesonide -formoterol  (SYMBICORT ) 80-4.5 MCG/ACT inhaler Inhale 2 puffs into the lungs 2 (two) times daily. 1 each 3   cetirizine  (ZYRTEC ) 10 MG tablet Take 1 tablet (10 mg total) by mouth daily. 30 tablet 3   cyclobenzaprine  (FLEXERIL ) 5 MG tablet Take 1-2 tablets (5-10 mg total) by mouth at bedtime as needed for muscle spasms. 30 tablet 1   diclofenac  Sodium (VOLTAREN )  1 % GEL Apply 4 g topically 4 (four) times daily. (Patient taking differently: Apply 4 g topically as needed.) 50 g 3   escitalopram  (LEXAPRO ) 10 MG tablet Take 1 tablet (10 mg total) by mouth daily. 90 tablet 3   promethazine -dextromethorphan (PROMETHAZINE -DM) 6.25-15 MG/5ML syrup Take 5 mLs by mouth 4 (four) times daily as needed for cough. 118 mL 0   Tavaborole  5 % SOLN Apply to toenails at bedtime 10 mL 2   terbinafine  (LAMISIL ) 250 MG tablet Take 1 tablet (250 mg total) by mouth daily. Take with food. 30 tablet 2   No current facility-administered medications on file prior to visit.    Review of Systems  Constitutional:  Negative for chills and fever.  Eyes:  Negative for visual disturbance.  Respiratory:  Negative for cough.   Cardiovascular:  Negative for chest pain and palpitations.  Gastrointestinal:  Negative for nausea and vomiting.  Neurological:  Negative for dizziness (resolved), syncope and headaches.  Psychiatric/Behavioral:  Negative for sleep disturbance.       Objective:    BP 126/68   Pulse 74   Temp 97.9 F (36.6 C) (Oral)   Ht 5' 5 (1.651 m)   Wt 267 lb 6.4 oz (121.3 kg)   SpO2 98%   BMI 44.50 kg/m  BP Readings from Last 3 Encounters:  10/16/24 126/68  10/08/24 130/82  10/03/24 123/80   Wt Readings from Last 3 Encounters:  10/16/24 267 lb 6.4 oz (121.3 kg)  10/03/24 277 lb (125.6 kg)  07/23/24 276 lb 6.4 oz (125.4 kg)      07/23/2024    8:20 AM 06/06/2024    3:00 PM 07/13/2023   10:16 AM  Depression screen PHQ 2/9  Decreased Interest 0 0 0  Down, Depressed, Hopeless 0 0 1  PHQ - 2 Score 0 0 1  Altered sleeping 0 0 1  Tired, decreased energy 0 0 0  Change in appetite 0 0 0  Feeling bad or failure about yourself  0 0 0  Trouble concentrating 0 0 1  Moving slowly or fidgety/restless 0 0 0  Suicidal thoughts 0 0 0  PHQ-9 Score 0  0  3   Difficult doing work/chores Not difficult at all Not difficult at all Not difficult at all     Data saved  with a previous flowsheet row definition      07/23/2024    8:20 AM 06/06/2024    3:01 PM 07/13/2023   10:16 AM 03/28/2023   10:23 AM  GAD 7 : Generalized Anxiety Score  Nervous, Anxious, on Edge 0 0 0 1  Control/stop worrying 0 0 0 1  Worry too much - different things 0 0 0 1  Trouble relaxing 0 0 0 0  Restless 0 0 0 0  Easily annoyed or irritable 0 0 0 1  Afraid - awful might happen 0 0 0 1  Total GAD 7 Score 0 0 0 5  Anxiety Difficulty Not difficult at all Not difficult at all Not difficult at all Somewhat difficult  Physical Exam Vitals reviewed.  Constitutional:      Appearance: She is well-developed.  HENT:     Mouth/Throat:     Pharynx: Uvula midline.  Eyes:     Conjunctiva/sclera: Conjunctivae normal.     Pupils: Pupils are equal, round, and reactive to light.     Comments: Fundus normal bilaterally.   Cardiovascular:     Rate and Rhythm: Normal rate and regular rhythm.     Pulses: Normal pulses.     Heart sounds: Normal heart sounds.  Pulmonary:     Effort: Pulmonary effort is normal.     Breath sounds: Normal breath sounds. No wheezing, rhonchi or rales.  Skin:    General: Skin is warm and dry.  Neurological:     Mental Status: She is alert.     Cranial Nerves: No cranial nerve deficit.     Sensory: No sensory deficit.     Deep Tendon Reflexes:     Reflex Scores:      Bicep reflexes are 2+ on the right side and 2+ on the left side.      Patellar reflexes are 2+ on the right side and 2+ on the left side.    Comments: Grip equal and strong bilateral upper extremities. Gait strong and steady. Able to perform rapid alternating movement without difficulty.   Psychiatric:        Speech: Speech normal.        Behavior: Behavior normal.        Thought Content: Thought content normal.

## 2024-10-16 NOTE — Telephone Encounter (Signed)
 Call [facility]. Patient has a bladder sling.  I am ordering an MRI of the brain.  Is there any contraindication to her having an MRI?

## 2024-10-16 NOTE — Telephone Encounter (Signed)
 Called [facility] spoke to nurse who stated that Judy Simmons was in clinic today but will respond as soon as she is out

## 2024-10-17 ENCOUNTER — Encounter: Payer: Self-pay | Admitting: Family

## 2024-10-29 ENCOUNTER — Telehealth: Payer: Self-pay

## 2024-10-29 NOTE — Telephone Encounter (Unsigned)
 Copied from CRM [id number]. Topic: Clinical - Prescription Issue >> Oct 29, 2024  3:25 PM Franky GRADE wrote: Reason for CRM: Patient is calling because she lost the prescription for Valium that was given to her on 10/16/2024 due to an MRI scheduled for tomorrow at 4:30 pm. She would like to know if we can send that to the pharmacy electronically cause she lost the hand written prescription. She uses the [id number] [place], [place] - [address] AT [facility] [address]  Phone: [phone] Fax: [phone]

## 2024-10-29 NOTE — Telephone Encounter (Signed)
 Copied from CRM [id number]. Topic: General - Other >> Oct 29, 2024  3:32 PM Franky GRADE wrote: Reason for CRM: Patient is calling because she sent a message earlier regarding a piercing she was unable to remove and has an MRI scheduled for tomorrow. She is going after work to a tattoo shop to see if they could possibly remove it but would like to know what will happen if they're unable to.

## 2024-10-29 NOTE — Telephone Encounter (Signed)
 Spoke to pt she stated that she will send my chart message this afternoon to let us  know if they were able to remove or not. Also would like to know if you can send in Valium just in case for MRI tomorrow, she lost rx you gave her

## 2024-10-30 ENCOUNTER — Ambulatory Visit
Admission: RE | Admit: 2024-10-30 | Discharge: 2024-10-30 | Disposition: A | Source: Ambulatory Visit | Attending: Family

## 2024-10-30 DIAGNOSIS — R55 Syncope and collapse: Secondary | ICD-10-CM | POA: Diagnosis not present

## 2024-10-30 DIAGNOSIS — R42 Dizziness and giddiness: Secondary | ICD-10-CM | POA: Insufficient documentation

## 2024-10-30 MED ORDER — GADOBUTROL 1 MMOL/ML IV SOLN
10.0000 mL | Freq: Once | INTRAVENOUS | Status: AC | PRN
  Administered 2024-10-30: 10 mL via INTRAVENOUS

## 2024-10-30 NOTE — Telephone Encounter (Signed)
 Pt sent mychart Piercing removed and she found her prescription  Sent patient mychart 10/30/24

## 2024-11-04 ENCOUNTER — Ambulatory Visit: Payer: Self-pay | Admitting: Family

## 2024-11-04 ENCOUNTER — Ambulatory Visit: Admitting: Dermatology

## 2024-11-15 ENCOUNTER — Telehealth (INDEPENDENT_AMBULATORY_CARE_PROVIDER_SITE_OTHER): Admitting: Family

## 2024-11-15 DIAGNOSIS — R42 Dizziness and giddiness: Secondary | ICD-10-CM | POA: Diagnosis not present

## 2024-11-15 NOTE — Progress Notes (Signed)
 Virtual Visit via Video Note  I connected with Judy Simmons on 11/15/2024 at 10:00 AM EST by a video enabled telemedicine application and verified that I am speaking with the correct person using two identifiers. Location patient: home Location provider: work  Persons participating in the virtual visit: patient, provider  I discussed the limitations of evaluation and management by telemedicine and the availability of in person appointments. The patient expressed understanding and agreed to proceed.  HPI: Feels well today.  No new complaints.  No recurrence of dizziness.  Denies pause in heart beat, palpitations, syncope.   MRI 10/30/2024 without acute intracranial abnormality ROS: See pertinent positives and negatives per HPI.  EXAM:  VITALS per patient if applicable: There were no vitals taken for this visit. BP Readings from Last 3 Encounters:  10/16/24 126/68  10/08/24 130/82  10/03/24 123/80   Wt Readings from Last 3 Encounters:  10/16/24 267 lb 6.4 oz (121.3 kg)  10/03/24 277 lb (125.6 kg)  07/23/24 276 lb 6.4 oz (125.4 kg)    GENERAL: alert, oriented, appears well and in no acute distress  HEENT: atraumatic, conjunttiva clear, no obvious abnormalities on inspection of external nose and ears  NECK: normal movements of the head and neck  LUNGS: on inspection no signs of respiratory distress, breathing rate appears normal, no obvious gross SOB, gasping or wheezing  CV: no obvious cyanosis  MS: moves all visible extremities without noticeable abnormality  PSYCH/NEURO: pleasant and cooperative, no obvious depression or anxiety, speech and thought processing grossly intact  ASSESSMENT AND PLAN: Dizziness Assessment & Plan: Fortunately no recurrence.  Reviewed MRI brain.  Patient t declines any further evaluation including ultrasound carotid, echocardiogram.  In the absence of palpitations, we agreed a ZIO monitor would not be appropriate.  She will let me know  if recurs.      -we discussed possible serious and likely etiologies, options for evaluation and workup, limitations of telemedicine visit vs in person visit, treatment, treatment risks and precautions. Pt prefers to treat via telemedicine empirically rather then risking or undertaking an in person visit at this moment.    I discussed the assessment and treatment plan with the patient. The patient was provided an opportunity to ask questions and all were answered. The patient agreed with the plan and demonstrated an understanding of the instructions.   The patient was advised to call back or seek an in-person evaluation if the symptoms worsen or if the condition fails to improve as anticipated.  Advised if desired AVS can be mailed or viewed via MyChart if Mychart user.   [name], FNP

## 2024-11-15 NOTE — Assessment & Plan Note (Signed)
 Fortunately no recurrence.  Reviewed MRI brain.  Patient t declines any further evaluation including ultrasound carotid, echocardiogram.  In the absence of palpitations, we agreed a ZIO monitor would not be appropriate.  She will let me know if recurs.

## 2024-12-04 ENCOUNTER — Ambulatory Visit: Admitting: Dermatology

## 2024-12-11 ENCOUNTER — Encounter: Payer: Self-pay | Admitting: Dermatology

## 2024-12-11 ENCOUNTER — Ambulatory Visit: Admitting: Dermatology

## 2024-12-11 DIAGNOSIS — B351 Tinea unguium: Secondary | ICD-10-CM

## 2024-12-11 DIAGNOSIS — Z79899 Other long term (current) drug therapy: Secondary | ICD-10-CM | POA: Diagnosis not present

## 2024-12-11 DIAGNOSIS — Z7189 Other specified counseling: Secondary | ICD-10-CM | POA: Diagnosis not present

## 2024-12-11 DIAGNOSIS — Z86018 Personal history of other benign neoplasm: Secondary | ICD-10-CM

## 2024-12-11 MED ORDER — TERBINAFINE HCL 250 MG PO TABS: 250.0000 mg | ORAL_TABLET | Freq: Every day | ORAL | 0 refills | Status: AC

## 2024-12-11 NOTE — Progress Notes (Signed)
" ° °  Follow-Up Visit   Subjective  Judy Simmons is a 54 y.o. female who presents for the following: Tinea Ungium toenails, Lamisil  ~2 months, pt not sure b/c she takes off and on, no side effects. Not using Tavaborole  5% sol, hx of Dysplastic Nevus R spinal lower back, recheck   The following portions of the chart were reviewed this encounter and updated as appropriate: medications, allergies, medical history  Review of Systems:  No other skin or systemic complaints except as noted in HPI or Assessment and Plan.  Objective  Well appearing patient in no apparent distress; mood and affect are within normal limits.   A focused examination was performed of the following areas: Toenails, back  Relevant exam findings are noted in the Assessment and Plan.  Toenails       Assessment & Plan   ONYCHOMYCOSIS toenails Exam: R great toenail distal thickening with yellow with distal sub ungual debris, ~80% clearance, L great toenail 10% clearance, thickening of bil 5th toenails, new photos today  Chronic and persistent condition with duration or expected duration over one year. Condition is symptomatic/ bothersome to patient. Not currently at goal.  Treatment Plan: Restart Lamisil  250mg  1 po qd with food x 2 month (will have completed 4 months total), may need additional month since there has been a lapse in treatment before completing full course. Restart Tavaborole  5% sol qhs  Terbinafine  Counseling  Terbinafine  is an anti-fungal medicine that can be applied to the skin (over the counter) or taken by mouth (prescription) to treat fungal infections. The pill version is often used to treat fungal infections of the nails or scalp. While most people do not have any side effects from taking terbinafine  pills, some possible side effects of the medicine can include taste changes, headache, loss of smell, vision changes, nausea, vomiting, or diarrhea.   Rare side effects can include  irritation of the liver, allergic reaction, or decrease in blood counts (which may show up as not feeling well or developing an infection). If you are concerned about any of these side effects, please stop the medicine and call your doctor, or in the case of an emergency such as feeling very unwell, seek immediate medical care.   Long term medication management.  Patient is using long term (months to years) prescription medication  to control their dermatologic condition.  These medications require periodic monitoring to evaluate for efficacy and side effects and may require periodic laboratory monitoring.   HISTORY OF DYSPLASTIC NEVUS No evidence of recurrence today- R spinal lower back Recommend regular full body skin exams Recommend daily broad spectrum sunscreen SPF 30+ to sun-exposed areas, reapply every 2 hours as needed.  Call if any new or changing lesions are noted between office visits   DYSPLASTIC NEVUS    Return in about 2 months (around 02/08/2025) for Tinea f/u.  I, Grayce Saunas, RMA, am acting as scribe for Rexene Rattler, MD .   Documentation: I have reviewed the above documentation for accuracy and completeness, and I agree with the above.  Rexene Rattler, MD    "

## 2024-12-11 NOTE — Patient Instructions (Signed)

## 2025-01-21 ENCOUNTER — Ambulatory Visit: Admit: 2025-01-21 | Admitting: Gastroenterology

## 2025-01-21 SURGERY — COLONOSCOPY
Anesthesia: General

## 2025-01-27 ENCOUNTER — Ambulatory Visit: Admitting: Dermatology
